# Patient Record
Sex: Female | Born: 1990
Health system: Southern US, Community
[De-identification: ages and names within clinical notes are randomized; demographics above are authoritative.]

## PROBLEM LIST (undated history)

## (undated) ENCOUNTER — Inpatient Hospital Stay (HOSPITAL_COMMUNITY): Payer: Self-pay

## (undated) DIAGNOSIS — F319 Bipolar disorder, unspecified: Secondary | ICD-10-CM

## (undated) DIAGNOSIS — M797 Fibromyalgia: Secondary | ICD-10-CM

## (undated) DIAGNOSIS — M5124 Other intervertebral disc displacement, thoracic region: Secondary | ICD-10-CM

## (undated) DIAGNOSIS — Z87448 Personal history of other diseases of urinary system: Secondary | ICD-10-CM

## (undated) DIAGNOSIS — M069 Rheumatoid arthritis, unspecified: Secondary | ICD-10-CM

## (undated) DIAGNOSIS — J45909 Unspecified asthma, uncomplicated: Secondary | ICD-10-CM

## (undated) DIAGNOSIS — D219 Benign neoplasm of connective and other soft tissue, unspecified: Secondary | ICD-10-CM

## (undated) DIAGNOSIS — N12 Tubulo-interstitial nephritis, not specified as acute or chronic: Secondary | ICD-10-CM

## (undated) DIAGNOSIS — M51369 Other intervertebral disc degeneration, lumbar region without mention of lumbar back pain or lower extremity pain: Secondary | ICD-10-CM

## (undated) DIAGNOSIS — N390 Urinary tract infection, site not specified: Secondary | ICD-10-CM

## (undated) DIAGNOSIS — M5136 Other intervertebral disc degeneration, lumbar region: Secondary | ICD-10-CM

## (undated) DIAGNOSIS — F429 Obsessive-compulsive disorder, unspecified: Secondary | ICD-10-CM

## (undated) DIAGNOSIS — O24419 Gestational diabetes mellitus in pregnancy, unspecified control: Secondary | ICD-10-CM

## (undated) DIAGNOSIS — Z87440 Personal history of urinary (tract) infections: Secondary | ICD-10-CM

## (undated) DIAGNOSIS — G43109 Migraine with aura, not intractable, without status migrainosus: Secondary | ICD-10-CM

## (undated) DIAGNOSIS — D649 Anemia, unspecified: Secondary | ICD-10-CM

## (undated) DIAGNOSIS — E282 Polycystic ovarian syndrome: Secondary | ICD-10-CM

## (undated) HISTORY — DX: Bipolar disorder, unspecified: F31.9

## (undated) HISTORY — DX: Other intervertebral disc displacement, thoracic region: M51.24

## (undated) HISTORY — DX: Other intervertebral disc degeneration, lumbar region: M51.36

## (undated) HISTORY — DX: Obsessive-compulsive disorder, unspecified: F42.9

## (undated) HISTORY — PX: NO PAST SURGERIES: SHX2092

## (undated) HISTORY — DX: Other intervertebral disc degeneration, lumbar region without mention of lumbar back pain or lower extremity pain: M51.369

## (undated) HISTORY — DX: Migraine with aura, not intractable, without status migrainosus: G43.109

---
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
## 2010-02-13 ENCOUNTER — Ambulatory Visit (HOSPITAL_COMMUNITY)
Admission: RE | Admit: 2010-02-13 | Discharge: 2010-02-13 | Payer: Self-pay | Source: Home / Self Care | Attending: Psychiatry | Admitting: Psychiatry

## 2010-03-20 ENCOUNTER — Encounter (HOSPITAL_COMMUNITY): Payer: 59 | Admitting: Physician Assistant

## 2010-03-20 DIAGNOSIS — F39 Unspecified mood [affective] disorder: Secondary | ICD-10-CM

## 2010-04-21 ENCOUNTER — Encounter (HOSPITAL_COMMUNITY): Payer: 59 | Admitting: Physician Assistant

## 2010-05-19 ENCOUNTER — Ambulatory Visit (HOSPITAL_COMMUNITY): Payer: 59 | Admitting: Marriage and Family Therapist

## 2010-06-02 NOTE — Discharge Summary (Signed)
NAMEROSS, HEFFERAN              ACCOUNT NO.:  [id number]   MEDICAL RECORD NO.:  [id number]          PATIENT TYPE:  INP   LOCATION:  9125                          FACILITY:  [facility]   PHYSICIAN:  Malachi Pro. Ambrose Mantle, M.D. DATE OF BIRTH:  03-Sep-1990   DATE OF ADMISSION:  07/27/2007  DATE OF DISCHARGE:  07/29/2007                               DISCHARGE SUMMARY   HISTORY OF PRESENT ILLNESS:  A 17-year white female para 0, gravida 1 at  39 plus weeks, had spontaneous rupture of membranes with clear fluid.  She had good fetal movement, no vaginal bleeding, occasional  contractions.  She had a late entry to prenatal care.  Otherwise, she  had an uncomplicated prenatal course.  She had spontaneous rupture of  membranes at 4:30 a.m. on the day of admission.  Blood group and type, A  positive.  Negative antibody, GC and chlamydia negative, RPR  nonreactive, rubella immune, cystic fibrosis negative, hepatitis B  surface antigen negative, HIV negative.  One-hour Glucola 145.  Three-  hour GTT 91, 68, 133, and 91.  Group B strep was positive.  She had an  ultrasound on May 29, 2007.  Estimated due date was July 30, 2007.  She  had an ultrasound on April 06, 2007.  Estimated due date of July 30, 2007.   PAST MEDICAL HISTORY:  History of migraines.   PAST SURGICAL HISTORY:  No surgical history.   OBSTETRICAL HISTORY:  No OB/GYN history.  She had no history of abnormal  Paps or STDs.  She took prenatal vitamins.   ALLERGIES:  She was had no known allergies.   FAMILY HISTORY:  Positive for hypertension in father and lung cancer.  Maternal grandfather and paternal grandfather both with lung cancer.  Sister with anxiety.  Maternal uncle alcoholic.  Diabetes in paternal  grandmother.   On admission, she was afebrile with normal vital signs.  Heart and lungs  were normal.  The abdomen was soft.  The fetal heart tones were in 140s.  Cervix was 1 cm, 40% vertex at -3.  The patient was started on  penicillin for group B strep prophylaxis.  She was placed on Pitocin for  augmentation.  By 1:30 p.m., she was uncomfortable with contractions.  Fetal heart tones were in the 120s-130s.  Contractions were every 2-4  minutes.  Pitocin was at 10 milliunits a minute.  By 5:50 p.m., she had  received an epidural.  Fetal heart tones were normal except for some  early decelerations and some variables and intrauterine pressure  catheter was placed without difficulty.  The cervix was 5 cm, 90%.  She  progressed to complete dilatation and began pushing.  She pushed  approximately 15 minutes to deliver a living female infant 8 pounds 8  ounces with Apgars of 8 at one and 9 at five minutes.  Placenta was  expressed intact.  There was a second-degree perineal in right and  bilateral periurethral lacerations repaired with 3-0 Vicryl.  Blood loss  was about 500 mL.   POSTPARTUM:  The patient did well and was discharged on  the second  postpartum day.   LABORATORY DATA:  Initial hemoglobin 11. 6, hematocrit 33.7, white count  10,400, platelet count 149,000.  Followup hemoglobin 10.0, hematocrit  28.5, white count 15,200, platelet count 144,000.  RPR was nonreactive.   FINAL DIAGNOSIS:  Intrauterine pregnancy at 39 weeks, delivered vertex.  Operations, spontaneous delivery vertex, repair of lacerations.   FINAL CONDITION:  Improved.   INSTRUCTIONS:  Include our regular discharge instruction booklet.  Percocet 5/325 30 tablets 1 q.4-6 h. as needed for pain and Motrin 600  mg 30 tablets 1 q.6 h. as needed for pain is given at discharge.  The  patient is advised to return in 6 weeks for followup examination.      Malachi Pro. Ambrose Mantle, M.D.  Electronically Signed     TFH/MEDQ  D:  07/29/2007  T:  07/29/2007  Job:  [id number]

## 2010-06-09 ENCOUNTER — Encounter (HOSPITAL_COMMUNITY): Payer: 59 | Admitting: Physician Assistant

## 2010-06-09 DIAGNOSIS — F39 Unspecified mood [affective] disorder: Secondary | ICD-10-CM

## 2010-06-17 ENCOUNTER — Ambulatory Visit (HOSPITAL_BASED_OUTPATIENT_CLINIC_OR_DEPARTMENT_OTHER): Payer: 59 | Admitting: Marriage and Family Therapist

## 2010-06-17 DIAGNOSIS — F3189 Other bipolar disorder: Secondary | ICD-10-CM

## 2010-06-23 ENCOUNTER — Encounter (HOSPITAL_COMMUNITY): Payer: 59 | Admitting: Marriage and Family Therapist

## 2010-06-29 ENCOUNTER — Encounter (HOSPITAL_COMMUNITY): Payer: 59 | Admitting: Marriage and Family Therapist

## 2010-06-29 DIAGNOSIS — F429 Obsessive-compulsive disorder, unspecified: Secondary | ICD-10-CM

## 2010-06-29 DIAGNOSIS — F329 Major depressive disorder, single episode, unspecified: Secondary | ICD-10-CM

## 2010-06-29 DIAGNOSIS — F3289 Other specified depressive episodes: Secondary | ICD-10-CM

## 2010-07-15 ENCOUNTER — Encounter (HOSPITAL_COMMUNITY): Payer: 59 | Admitting: Physician Assistant

## 2010-07-15 DIAGNOSIS — F39 Unspecified mood [affective] disorder: Secondary | ICD-10-CM

## 2010-08-03 ENCOUNTER — Encounter (HOSPITAL_COMMUNITY): Payer: 59 | Admitting: Marriage and Family Therapist

## 2010-08-12 ENCOUNTER — Encounter (HOSPITAL_COMMUNITY): Payer: 59 | Admitting: Physician Assistant

## 2010-08-12 DIAGNOSIS — F3189 Other bipolar disorder: Secondary | ICD-10-CM

## 2010-08-17 ENCOUNTER — Encounter (HOSPITAL_COMMUNITY): Payer: 59 | Admitting: Marriage and Family Therapist

## 2010-08-24 ENCOUNTER — Encounter (HOSPITAL_COMMUNITY): Payer: 59 | Admitting: Marriage and Family Therapist

## 2010-08-25 ENCOUNTER — Encounter (HOSPITAL_COMMUNITY): Payer: 59 | Admitting: Physician Assistant

## 2010-08-26 ENCOUNTER — Encounter (HOSPITAL_COMMUNITY): Payer: 59 | Admitting: Marriage and Family Therapist

## 2010-10-09 LAB — URINALYSIS, ROUTINE W REFLEX MICROSCOPIC
Bilirubin Urine: NEGATIVE
Glucose, UA: NEGATIVE
Hgb urine dipstick: NEGATIVE
Ketones, ur: NEGATIVE
Nitrite: NEGATIVE
Urobilinogen, UA: 1

## 2010-10-09 LAB — POCT PREGNANCY, URINE: Operator id: 272551

## 2010-10-09 LAB — WET PREP, GENITAL: Trich, Wet Prep: NONE SEEN

## 2010-10-09 LAB — GC/CHLAMYDIA PROBE AMP, GENITAL: Chlamydia, DNA Probe: NEGATIVE

## 2010-10-09 LAB — URINE MICROSCOPIC-ADD ON

## 2010-10-09 LAB — URINE CULTURE

## 2010-10-09 LAB — HCG, QUANTITATIVE, PREGNANCY: hCG, Beta Chain, Quant, S: 7433 — ABNORMAL HIGH

## 2010-10-09 LAB — ABO/RH: ABO/RH(D): A POS

## 2010-10-15 LAB — CBC
HCT: 33.7 — ABNORMAL LOW
Hemoglobin: 10 — ABNORMAL LOW
Hemoglobin: 11.6 — ABNORMAL LOW
MCHC: 34.3
MCHC: 35
MCV: 93.4
Platelets: 149 — ABNORMAL LOW
RBC: 3.61 — ABNORMAL LOW
RDW: 15.4

## 2010-10-15 LAB — RPR: RPR Ser Ql: NONREACTIVE

## 2011-10-13 LAB — HM PAP SMEAR

## 2012-08-27 ENCOUNTER — Encounter (HOSPITAL_BASED_OUTPATIENT_CLINIC_OR_DEPARTMENT_OTHER): Payer: Self-pay | Admitting: Emergency Medicine

## 2012-08-27 ENCOUNTER — Emergency Department (HOSPITAL_BASED_OUTPATIENT_CLINIC_OR_DEPARTMENT_OTHER): Payer: 59

## 2012-08-27 ENCOUNTER — Emergency Department (HOSPITAL_BASED_OUTPATIENT_CLINIC_OR_DEPARTMENT_OTHER)
Admission: EM | Admit: 2012-08-27 | Discharge: 2012-08-27 | Disposition: A | Discharge status: Home / Self Care | Payer: 59 | Attending: Emergency Medicine | Admitting: Emergency Medicine

## 2012-08-27 DIAGNOSIS — J45909 Unspecified asthma, uncomplicated: Secondary | ICD-10-CM | POA: Insufficient documentation

## 2012-08-27 DIAGNOSIS — R110 Nausea: Secondary | ICD-10-CM | POA: Insufficient documentation

## 2012-08-27 DIAGNOSIS — Z3202 Encounter for pregnancy test, result negative: Secondary | ICD-10-CM | POA: Insufficient documentation

## 2012-08-27 DIAGNOSIS — R350 Frequency of micturition: Secondary | ICD-10-CM | POA: Insufficient documentation

## 2012-08-27 DIAGNOSIS — Z91040 Latex allergy status: Secondary | ICD-10-CM | POA: Insufficient documentation

## 2012-08-27 DIAGNOSIS — N12 Tubulo-interstitial nephritis, not specified as acute or chronic: Secondary | ICD-10-CM | POA: Insufficient documentation

## 2012-08-27 DIAGNOSIS — Z8742 Personal history of other diseases of the female genital tract: Secondary | ICD-10-CM | POA: Insufficient documentation

## 2012-08-27 DIAGNOSIS — Z79899 Other long term (current) drug therapy: Secondary | ICD-10-CM | POA: Insufficient documentation

## 2012-08-27 DIAGNOSIS — N898 Other specified noninflammatory disorders of vagina: Secondary | ICD-10-CM | POA: Insufficient documentation

## 2012-08-27 DIAGNOSIS — Z87891 Personal history of nicotine dependence: Secondary | ICD-10-CM | POA: Insufficient documentation

## 2012-08-27 HISTORY — DX: Unspecified asthma, uncomplicated: J45.909

## 2012-08-27 LAB — CBC WITH DIFFERENTIAL/PLATELET
Basophils Relative: 0 % (ref 0–1)
Eosinophils Relative: 1 % (ref 0–5)
Hemoglobin: 14.2 g/dL (ref 12.0–15.0)
Lymphocytes Relative: 20 % (ref 12–46)
MCH: 30.5 pg (ref 26.0–34.0)
MCV: 89 fL (ref 78.0–100.0)
Monocytes Relative: 5 % (ref 3–12)
Neutrophils Relative %: 74 % (ref 43–77)
Platelets: 304 10*3/uL (ref 150–400)
RDW: 13.4 % (ref 11.5–15.5)
WBC: 16.6 10*3/uL — ABNORMAL HIGH (ref 4.0–10.5)

## 2012-08-27 LAB — COMPREHENSIVE METABOLIC PANEL
ALT: 19 U/L (ref 0–35)
BUN: 8 mg/dL (ref 6–23)
Calcium: 10.2 mg/dL (ref 8.4–10.5)
Chloride: 99 mEq/L (ref 96–112)
Creatinine, Ser: 0.8 mg/dL (ref 0.50–1.10)
GFR calc non Af Amer: >90 mL/min (ref >90)
Sodium: 137 mEq/L (ref 135–145)
Total Protein: 7.9 g/dL (ref 6.0–8.3)

## 2012-08-27 LAB — URINE MICROSCOPIC-ADD ON

## 2012-08-27 LAB — URINALYSIS, ROUTINE W REFLEX MICROSCOPIC
Specific Gravity, Urine: 1.015 (ref 1.005–1.030)
pH: 5 (ref 5.0–8.0)

## 2012-08-27 LAB — PREGNANCY, URINE: Preg Test, Ur: NEGATIVE

## 2012-08-27 LAB — LIPASE, BLOOD: Lipase: 21 U/L (ref 11–59)

## 2012-08-27 MED ORDER — ONDANSETRON HCL 4 MG/2ML IJ SOLN
4.0000 mg | Freq: Once | INTRAMUSCULAR | Status: AC
  Administered 2012-08-27: 4 mg via INTRAVENOUS
  Filled 2012-08-27: qty 2

## 2012-08-27 MED ORDER — OXYCODONE-ACETAMINOPHEN 5-325 MG PO TABS: 2.0000 | ORAL_TABLET | ORAL | Status: DC | PRN

## 2012-08-27 MED ORDER — MORPHINE SULFATE 4 MG/ML IJ SOLN
4.0000 mg | Freq: Once | INTRAMUSCULAR | Status: AC
  Administered 2012-08-27: 4 mg via INTRAVENOUS
  Filled 2012-08-27: qty 1

## 2012-08-27 MED ORDER — CEPHALEXIN 500 MG PO CAPS: 500.0000 mg | ORAL_CAPSULE | Freq: Four times a day (QID) | ORAL | Status: DC

## 2012-08-27 MED ORDER — KETOROLAC TROMETHAMINE 30 MG/ML IJ SOLN
30.0000 mg | Freq: Once | INTRAMUSCULAR | Status: AC
  Administered 2012-08-27: 30 mg via INTRAVENOUS
  Filled 2012-08-27: qty 1

## 2012-08-27 MED ORDER — DEXTROSE 5 % IV SOLN
1.0000 g | INTRAVENOUS | Status: DC
  Administered 2012-08-27: 1 g via INTRAVENOUS
  Filled 2012-08-27: qty 10

## 2012-08-27 NOTE — ED Notes (Signed)
RLQ pain and tenderness began during night last night.  Right flank pain in past hour. Nausea but no vomiting.

## 2012-08-27 NOTE — ED Provider Notes (Signed)
CSN: [id number]     Arrival date & time 08/27/12  1801 History    This chart was scribed for Toy Baker, MD by Quintella Reichert, ED scribe.  This patient was seen in room MH12/MH12 and the patient's care was started at 6:24 PM.     Chief Complaint  Patient presents with  . Abdominal Pain    The history is provided by the patient. No language interpreter was used.    HPI Comments: Kathleen Sanders is a 22 y.o. female with h/o PCOS who presents to the Emergency Department complaining of constant moderate RLQ pain that began last night, with accompanying nausea, urinary frequency, and vaginal bleeding.  Pain began when pt was sleeping and has since moved "slightly upwards and towards the back."  It is described as "a combination of pressure and sharp."  It is exacerbated by walking.  It is not associated with food.  Pt has not attempted to treat pain pta.  She also states she feels she is urinating more frequently than normal and complains of "light, mucusy-brown" vaginal bleeding.  LNMP ended last week.   She denies emesis or fever.     Past Medical History  Diagnosis Date  . Asthma     History reviewed. No pertinent past surgical history.   No family history on file.   History  Substance Use Topics  . Smoking status: Former Games developer  . Smokeless tobacco: Not on file  . Alcohol Use: Yes     Comment: occ    OB History   Grav Para Term Preterm Abortions TAB SAB Ect Mult Living                   Review of Systems  Gastrointestinal: Positive for nausea and abdominal pain. Negative for vomiting.  Genitourinary: Positive for frequency and vaginal bleeding.  All other systems reviewed and are negative.      Allergies  Latex  Home Medications   Current Outpatient Rx  Name  Route  Sig  Dispense  Refill  . norethindrone-ethinyl estradiol (JUNEL FE,GILDESS FE,LOESTRIN FE) 1-20 MG-MCG tablet   Oral   Take 1 tablet by mouth daily.          BP 135/64  Pulse 88   Temp(Src) 98.7 F (37.1 C) (Oral)  Resp 20  SpO2 100%  LMP 08/13/2012  Physical Exam  Nursing note and vitals reviewed. Constitutional: She is oriented to person, place, and time. She appears well-developed and well-nourished.  Non-toxic appearance. No distress.  HENT:  Head: Normocephalic and atraumatic.  Eyes: Conjunctivae, EOM and lids are normal. Pupils are equal, round, and reactive to light.  Neck: Normal range of motion. Neck supple. No tracheal deviation present. No mass present.  Cardiovascular: Normal rate, regular rhythm and normal heart sounds.  Exam reveals no gallop.   No murmur heard. Pulmonary/Chest: Effort normal and breath sounds normal. No stridor. No respiratory distress. She has no decreased breath sounds. She has no wheezes. She has no rhonchi. She has no rales.  Abdominal: Soft. Normal appearance and bowel sounds are normal. She exhibits no distension and no mass (None palpable). There is tenderness (RUQ and RLQ). There is no rebound, no guarding and no CVA tenderness.  Musculoskeletal: Normal range of motion. She exhibits no edema and no tenderness.  Neurological: She is alert and oriented to person, place, and time. She has normal strength. No cranial nerve deficit or sensory deficit. GCS eye subscore is 4. GCS verbal subscore is  5. GCS motor subscore is 6.  Skin: Skin is warm and dry. No abrasion and no rash noted.  Psychiatric: She has a normal mood and affect. Her speech is normal and behavior is normal.    ED Course  Procedures (including critical care time)  DIAGNOSTIC STUDIES: Oxygen Saturation is 100% on room air, normal by my interpretation.    COORDINATION OF CARE: 6:30 PM-Discussed treatment plan which includes pain medication, anti-emetics, UA, labs and abdomen CT with pt at bedside and pt agreed to plan.    Labs Reviewed  URINALYSIS, ROUTINE W REFLEX MICROSCOPIC - Abnormal; Notable for the following:    APPearance CLOUDY (*)    Hgb urine  dipstick MODERATE (*)    Protein, ur 100 (*)    Leukocytes, UA MODERATE (*)    All other components within normal limits  CBC WITH DIFFERENTIAL - Abnormal; Notable for the following:    WBC 16.6 (*)    Neutro Abs 12.2 (*)    All other components within normal limits  URINE MICROSCOPIC-ADD ON - Abnormal; Notable for the following:    Bacteria, UA MANY (*)    All other components within normal limits  URINE CULTURE  PREGNANCY, URINE  COMPREHENSIVE METABOLIC PANEL  LIPASE, BLOOD    Ct Abdomen Pelvis Wo Contrast  08/27/2012   *RADIOLOGY REPORT*  Clinical Data: Right flank pain  CT ABDOMEN AND PELVIS WITHOUT CONTRAST  Technique:  Multidetector CT imaging of the abdomen and pelvis was performed following the standard protocol without intravenous contrast.  Comparison: None.  Findings: There is no hydronephrosis.  The central right renal sinus fat is hazy.  There is also stranding along the course of the right ureter.  There is slight dilatation of the right ureter.  No ureteral calculi.  Unremarkable appearance of the left kidney.  Unenhanced liver, gallbladder, spleen, pancreas, adrenal glands are normal.  Uterus, adnexa, and bladder are normal.  Advanced degenerative disc disease at L5 S1 with vacuum.  IMPRESSION: Although no ureteral calculus or hydronephrosis is present, there is a stranding involving the right renal collecting system and ureter.  Findings most likely represent a recently passed calculus or an inflammatory process.  Correlate with urinalysis.   Original Report Authenticated By: Jolaine Click, M.D.    No diagnosis found.  MDM  Patient given Rocephin here and pain medication feels better. She be treated for pyelonephritis in stable for discharge    I personally performed the services described in this documentation, which was scribed in my presence. The recorded information has been reviewed and is accurate.     Toy Baker, MD 08/27/12 2016

## 2012-08-29 LAB — URINE CULTURE

## 2012-08-30 NOTE — ED Notes (Signed)
+   Urine Culture Patient treated per protocol MD.

## 2012-08-31 ENCOUNTER — Other Ambulatory Visit: Payer: Self-pay | Admitting: Obstetrics and Gynecology

## 2012-08-31 NOTE — Telephone Encounter (Signed)
eScribe request for refill on JUNEL FE 1/20 Last filled - 10/13/11, 1 PK X 12 RF, pt using continuous Last AEX - 10/13/11 Next AEX - not scheduled. Refill sent as pt is using continuous.

## 2012-10-16 ENCOUNTER — Encounter: Payer: Self-pay | Admitting: Nurse Practitioner

## 2012-10-16 ENCOUNTER — Ambulatory Visit (INDEPENDENT_AMBULATORY_CARE_PROVIDER_SITE_OTHER): Payer: 59 | Admitting: Nurse Practitioner

## 2012-10-16 VITALS — BP 142/72 | HR 76 | Ht 66.0 in | Wt 254.0 lb

## 2012-10-16 DIAGNOSIS — Z01419 Encounter for gynecological examination (general) (routine) without abnormal findings: Secondary | ICD-10-CM

## 2012-10-16 DIAGNOSIS — Z0000 Encounter for general adult medical examination without abnormal findings: Secondary | ICD-10-CM

## 2012-10-16 LAB — POCT URINALYSIS DIPSTICK
Glucose, UA: NEGATIVE
Nitrite, UA: NEGATIVE
Urobilinogen, UA: NEGATIVE

## 2012-10-16 MED ORDER — NORETHINDRONE ACET-ETHINYL EST 1.5-30 MG-MCG PO TABS: ORAL_TABLET | ORAL | Status: DC

## 2012-10-16 NOTE — Progress Notes (Signed)
Patient ID: Kathleen Sanders, female   DOB: 03/18/1990, 22 y.o.   MRN: [id number] 22 y.o. G1P1001 Legally Separated Caucasian Fe here for annual exam.  Divorce is on hold due to cost.  Same partner for 1.5 years.  He is actually the husbands best friend.   Patient's last menstrual period was 10/11/2012.          Sexually active: yes  The current method of family planning is OCP (estrogen/progesterone).    Exercising: No. The patient does not participate in regular exercise at present. Smoker:  Former smoker   Health Maintenance: Pap:  10/13/11, WNL TDaP:  Age 57 Gardasil: 2 injection completed Labs: HB: Urine: neg.   reports that she has quit smoking. She does not have any smokeless tobacco history on file. She reports that  drinks alcohol. She reports that she does not use illicit drugs.  Past Medical History  Diagnosis Date  . Asthma   . Bipolar disorder   . OCD (obsessive compulsive disorder)   . Migraine     No past surgical history on file.  Current Outpatient Prescriptions  Medication Sig Dispense Refill  . cephALEXin (KEFLEX) 500 MG capsule Take 1 capsule (500 mg total) by mouth 4 (four) times daily.  28 capsule  0  . JUNEL 1.5/30 1.5-30 MG-MCG tablet TAKE 1 TABLET BY MOUTH EVERY DAY  21 tablet  1  . norethindrone-ethinyl estradiol (JUNEL FE,GILDESS FE,LOESTRIN FE) 1-20 MG-MCG tablet Take 1 tablet by mouth daily.      Marland Kitchen oxyCODONE-acetaminophen (PERCOCET/ROXICET) 5-325 MG per tablet Take 2 tablets by mouth every 4 (four) hours as needed for pain.  20 tablet  0   No current facility-administered medications for this visit.    Family History  Problem Relation Age of Onset  . Colon cancer Maternal Grandmother   . Colon cancer Paternal Grandfather     ROS:  Pertinent items are noted in HPI.  Otherwise, a comprehensive ROS was negative.  Exam:   BP 142/72  Pulse 76  Ht 5\' 6"  (1.676 m)  Wt 254 lb (115.214 kg)  BMI 41.02 kg/m2  LMP 10/11/2012 Height: 5\' 6"  (167.6 cm)  Ht  Readings from Last 3 Encounters:  10/16/12 5\' 6"  (1.676 m)    General appearance: alert, cooperative and appears stated age Head: Normocephalic, without obvious abnormality, atraumatic Neck: no adenopathy, supple, symmetrical, trachea midline and thyroid normal to inspection and palpation Lungs: clear to auscultation bilaterally Breasts: normal appearance, no masses or tenderness Heart: regular rate and rhythm Abdomen: soft, non-tender; no masses,  no organomegaly Extremities: extremities normal, atraumatic, no cyanosis or edema Skin: Skin color, texture, turgor normal. No rashes or lesions Lymph nodes: Cervical, supraclavicular, and axillary nodes normal. No abnormal inguinal nodes palpated Neurologic: Grossly normal   Pelvic: External genitalia:  no lesions              Urethra:  normal appearing urethra with no masses, tenderness or lesions              Bartholin's and Skene's: normal                 Vagina: normal appearing vagina with normal color and discharge, no lesions              Cervix: anteverted              Pap taken: no Bimanual Exam:  Uterus:  normal size, contour, position, consistency, mobility, non-tender  Adnexa: no mass, fullness, tenderness               Rectovaginal: Confirms               Anus:  normal sphincter tone, no lesions  A:  Well Woman with normal exam  Contraception  Marital discord  P:   Pap smear as per guidelines Not done today  Refill on OCP Junel 1.5/30 for a year  Counseled on diet, exercise and weight management return annually or prn  An After Visit Summary was printed and given to the patient.

## 2012-10-16 NOTE — Patient Instructions (Addendum)
General topics  Next pap or exam is  due in 1 year Take a Women's multivitamin Take 1200 mg. of calcium daily - prefer dietary If any concerns in interim to call back  Breast Self-Awareness Practicing breast self-awareness may pick up problems early, prevent significant medical complications, and possibly save your life. By practicing breast self-awareness, you can become familiar with how your breasts look and feel and if your breasts are changing. This allows you to notice changes early. It can also offer you some reassurance that your breast health is good. One way to learn what is normal for your breasts and whether your breasts are changing is to do a breast self-exam. If you find a lump or something that was not present in the past, it is best to contact your caregiver right away. Other findings that should be evaluated by your caregiver include nipple discharge, especially if it is bloody; skin changes or reddening; areas where the skin seems to be pulled in (retracted); or new lumps and bumps. Breast pain is seldom associated with cancer (malignancy), but should also be evaluated by a caregiver. BREAST SELF-EXAM The best time to examine your breasts is 5 7 days after your menstrual period is over.  ExitCare Patient Information 2013 [employer].   Exercise to Stay Healthy Exercise helps you become and stay healthy. EXERCISE IDEAS AND TIPS Choose exercises that:  You enjoy.  Fit into your day. You do not need to exercise really hard to be healthy. You can do exercises at a slow or medium level and stay healthy. You can:  Stretch before and after working out.  Try yoga, Pilates, or tai chi.  Lift weights.  Walk fast, swim, jog, run, climb stairs, bicycle, dance, or rollerskate.  Take aerobic classes. Exercises that burn about 150 calories:  Running 1  miles in 15 minutes.  Playing volleyball for 45 to 60 minutes.  Washing and waxing a car for 45 to 60  minutes.  Playing touch football for 45 minutes.  Walking 1  miles in 35 minutes.  Pushing a stroller 1  miles in 30 minutes.  Playing basketball for 30 minutes.  Raking leaves for 30 minutes.  Bicycling 5 miles in 30 minutes.  Walking 2 miles in 30 minutes.  Dancing for 30 minutes.  Shoveling snow for 15 minutes.  Swimming laps for 20 minutes.  Walking up stairs for 15 minutes.  Bicycling 4 miles in 15 minutes.  Gardening for 30 to 45 minutes.  Jumping rope for 15 minutes.  Washing windows or floors for 45 to 60 minutes. Document Released: 02/06/2010 Document Revised: 03/29/2011 Document Reviewed: 02/06/2010 ExitCare Patient Information 2013 [employer].   Other topics ( that may be useful information):    Sexually Transmitted Disease Sexually transmitted disease (STD) refers to any infection that is passed from person to person during sexual activity. This may happen by way of saliva, semen, blood, vaginal mucus, or urine. Common STDs include:  Gonorrhea.  Chlamydia.  Syphilis.  HIV/AIDS.  Genital herpes.  Hepatitis B and C.  Trichomonas.  Human papillomavirus (HPV).  Pubic lice. CAUSES  An STD may be spread by bacteria, virus, or parasite. A person can get an STD by:  Sexual intercourse with an infected person.  Sharing sex toys with an infected person.  Sharing needles with an infected person.  Having intimate contact with the genitals, mouth, or rectal areas of an infected person. SYMPTOMS  Some people may not have any symptoms, but   they can still pass the infection to others. Different STDs have different symptoms. Symptoms include:  Painful or bloody urination.  Pain in the pelvis, abdomen, vagina, anus, throat, or eyes.  Skin rash, itching, irritation, growths, or sores (lesions). These usually occur in the genital or anal area.  Abnormal vaginal discharge.  Penile discharge in men.  Soft, flesh-colored skin growths in the  genital or anal area.  Fever.  Pain or bleeding during sexual intercourse.  Swollen glands in the groin area.  Yellow skin and eyes (jaundice). This is seen with hepatitis. DIAGNOSIS  To make a diagnosis, your caregiver may:  Take a medical history.  Perform a physical exam.  Take a specimen (culture) to be examined.  Examine a sample of discharge under a microscope.  Perform blood test TREATMENT   Chlamydia, gonorrhea, trichomonas, and syphilis can be cured with antibiotic medicine.  Genital herpes, hepatitis, and HIV can be treated, but not cured, with prescribed medicines. The medicines will lessen the symptoms.  Genital warts from HPV can be treated with medicine or by freezing, burning (electrocautery), or surgery. Warts may come back.  HPV is a virus and cannot be cured with medicine or surgery.However, abnormal areas may be followed very closely by your caregiver and may be removed from the cervix, vagina, or vulva through office procedures or surgery. If your diagnosis is confirmed, your recent sexual partners need treatment. This is true even if they are symptom-free or have a negative culture or evaluation. They should not have sex until their caregiver says it is okay. HOME CARE INSTRUCTIONS  All sexual partners should be informed, tested, and treated for all STDs.  Take your antibiotics as directed. Finish them even if you start to feel better.  Only take over-the-counter or prescription medicines for pain, discomfort, or fever as directed by your caregiver.  Rest.  Eat a balanced diet and drink enough fluids to keep your urine clear or pale yellow.  Do not have sex until treatment is completed and you have followed up with your caregiver. STDs should be checked after treatment.  Keep all follow-up appointments, Pap tests, and blood tests as directed by your caregiver.  Only use latex condoms and water-soluble lubricants during sexual activity. Do not use  petroleum jelly or oils.  Avoid alcohol and illegal drugs.  Get vaccinated for HPV and hepatitis. If you have not received these vaccines in the past, talk to your caregiver about whether one or both might be right for you.  Avoid risky sex practices that can break the skin. The only way to avoid getting an STD is to avoid all sexual activity.Latex condoms and dental dams (for oral sex) will help lessen the risk of getting an STD, but will not completely eliminate the risk. SEEK MEDICAL CARE IF:   You have a fever.  You have any new or worsening symptoms. Document Released: 03/27/2002 Document Revised: 03/29/2011 Document Reviewed: 04/03/2010 ExitCare Patient Information 2013 [employer].    Domestic Abuse You are being battered or abused if someone close to you hits, pushes, or physically hurts you in any way. You also are being abused if you are forced into activities. You are being sexually abused if you are forced to have sexual contact of any kind. You are being emotionally abused if you are made to feel worthless or if you are constantly threatened. It is important to remember that help is available. No one has the right to abuse you. PREVENTION OF FURTHER   ABUSE  Learn the warning signs of danger. This varies with situations but may include: the use of alcohol, threats, isolation from friends and family, or forced sexual contact. Leave if you feel that violence is going to occur.  If you are attacked or beaten, report it to the police so the abuse is documented. You do not have to press charges. The police can protect you while you or the attackers are leaving. Get the officer's name and badge number and a copy of the report.  Find someone you can trust and tell them what is happening to you: your caregiver, a nurse, clergy member, close friend or family member. Feeling ashamed is natural, but remember that you have done nothing wrong. No one deserves abuse. Document Released:  01/02/2000 Document Revised: 03/29/2011 Document Reviewed: 03/12/2010 ExitCare Patient Information 2013 [employer].    How Much is Too Much Alcohol? Drinking too much alcohol can cause injury, accidents, and health problems. These types of problems can include:   Car crashes.  Falls.  Family fighting (domestic violence).  Drowning.  Fights.  Injuries.  Burns.  Damage to certain organs.  Having a baby with birth defects. ONE DRINK CAN BE TOO MUCH WHEN YOU ARE:  Working.  Pregnant or breastfeeding.  Taking medicines. Ask your doctor.  Driving or planning to drive. If you or someone you know has a drinking problem, get help from a doctor.  Document Released: 10/31/2008 Document Revised: 03/29/2011 Document Reviewed: 10/31/2008 ExitCare Patient Information 2013 [employer].   Smoking Hazards Smoking cigarettes is extremely bad for your health. Tobacco smoke has over 200 known poisons in it. There are over 60 chemicals in tobacco smoke that cause cancer. Some of the chemicals found in cigarette smoke include:   Cyanide.  Benzene.  Formaldehyde.  Methanol (wood alcohol).  Acetylene (fuel used in welding torches).  Ammonia. Cigarette smoke also contains the poisonous gases nitrogen oxide and carbon monoxide.  Cigarette smokers have an increased risk of many serious medical problems and Smoking causes approximately:  90% of all lung cancer deaths in men.  80% of all lung cancer deaths in women.  90% of deaths from chronic obstructive lung disease. Compared with nonsmokers, smoking increases the risk of:  Coronary heart disease by 2 to 4 times.  Stroke by 2 to 4 times.  Men developing lung cancer by 23 times.  Women developing lung cancer by 13 times.  Dying from chronic obstructive lung diseases by 12 times.  . Smoking is the most preventable cause of death and disease in our society.  WHY IS SMOKING ADDICTIVE?  Nicotine is the chemical  agent in tobacco that is capable of causing addiction or dependence.  When you smoke and inhale, nicotine is absorbed rapidly into the bloodstream through your lungs. Nicotine absorbed through the lungs is capable of creating a powerful addiction. Both inhaled and non-inhaled nicotine may be addictive.  Addiction studies of cigarettes and spit tobacco show that addiction to nicotine occurs mainly during the teen years, when young people begin using tobacco products. WHAT ARE THE BENEFITS OF QUITTING?  There are many health benefits to quitting smoking.   Likelihood of developing cancer and heart disease decreases. Health improvements are seen almost immediately.  Blood pressure, pulse rate, and breathing patterns start returning to normal soon after quitting. QUITTING SMOKING   American Lung Association - 1-800-LUNGUSA  American Cancer Society - 1-800-ACS-2345 Document Released: 02/12/2004 Document Revised: 03/29/2011 Document Reviewed: 10/16/2008 ExitCare Patient Information 2013 [employer].   Stress Management Stress is a state of physical or mental tension that often results from changes in your life or normal routine. Some common causes of stress are:  Death of a loved one.  Injuries or severe illnesses.  Getting fired or changing jobs.  Moving into a new home. Other causes may be:  Sexual problems.  Business or financial losses.  Taking on a large debt.  Regular conflict with someone at home or at work.  Constant tiredness from lack of sleep. It is not just bad things that are stressful. It may be stressful to:  Win the lottery.  Get married.  Buy a new car. The amount of stress that can be easily tolerated varies from person to person. Changes generally cause stress, regardless of the types of change. Too much stress can affect your health. It may lead to physical or emotional problems. Too little stress (boredom) may also become stressful. SUGGESTIONS TO  REDUCE STRESS:  Talk things over with your family and friends. It often is helpful to share your concerns and worries. If you feel your problem is serious, you may want to get help from a professional counselor.  Consider your problems one at a time instead of lumping them all together. Trying to take care of everything at once may seem impossible. List all the things you need to do and then start with the most important one. Set a goal to accomplish 2 or 3 things each day. If you expect to do too many in a single day you will naturally fail, causing you to feel even more stressed.  Do not use alcohol or drugs to relieve stress. Although you may feel better for a short time, they do not remove the problems that caused the stress. They can also be habit forming.  Exercise regularly - at least 3 times per week. Physical exercise can help to relieve that "uptight" feeling and will relax you.  The shortest distance between despair and hope is often a good night's sleep.  Go to bed and get up on time allowing yourself time for appointments without being rushed.  Take a short "time-out" period from any stressful situation that occurs during the day. Close your eyes and take some deep breaths. Starting with the muscles in your face, tense them, hold it for a few seconds, then relax. Repeat this with the muscles in your neck, shoulders, hand, stomach, back and legs.  Take good care of yourself. Eat a balanced diet and get plenty of rest.  Schedule time for having fun. Take a break from your daily routine to relax. HOME CARE INSTRUCTIONS   Call if you feel overwhelmed by your problems and feel you can no longer manage them on your own.  Return immediately if you feel like hurting yourself or someone else. Document Released: 06/30/2000 Document Revised: 03/29/2011 Document Reviewed: 02/20/2007 ExitCare Patient Information 2013 [employer].   

## 2012-10-17 NOTE — Progress Notes (Signed)
Reviewed personally.  M. Suzanne Juhi Lagrange, MD.  

## 2012-10-25 ENCOUNTER — Telehealth: Payer: Self-pay | Admitting: Nurse Practitioner

## 2012-10-25 NOTE — Telephone Encounter (Signed)
The only other suggestion to query is, if she has changed partners because STD's can cause AUB.

## 2012-10-25 NOTE — Telephone Encounter (Signed)
Pt is having pain with her period and it is early.

## 2012-10-25 NOTE — Telephone Encounter (Signed)
Message left to return call to Salima Rumer at [phone].    

## 2012-10-25 NOTE — Telephone Encounter (Signed)
Kathleen Sanders is a 22 y.o. female Who calls with c/o seeing brown blood when wiping x 2 days and having abdominal cramping. Has been on Junel for over one year. Has not missed any pills.  Last period was approximately 9/20. Due for period next week. Last menses have been WNL for patient.   Advised patient to take home pregnancy test, she is sexually active. Patient does not not feel this is implantation bleeding as she has had that before. I advised to continue birth control pills as directed. Motrin instructions given . Can take Motrin=Advil=Ibuprofen 800 mg (Can purchase over the counter, you will need four 200 mg pills) every 8 hours as needed.  Take with food for cramps.   Advised patient to journal menses and call back if bleeding is ongoing or becomes heavier, or if home pregnancy test in positive patient verbalized understanding. I advised that I would call back if additional instructions from [place].

## 2012-10-26 NOTE — Telephone Encounter (Signed)
Spoke with pt to inquire about any new partners, which pt denies. Pt says the bleeding looks "more normal" today, it is just early for a period. Advised pt to keep track of bleeding on a calendar, take pills at the same time each day, and call for any abnormal bleeding next month. Advised that increased stress could possibly disrupt her periods. Pt will call back if needed.

## 2012-10-26 NOTE — Telephone Encounter (Signed)
Agree with plan 

## 2012-11-05 ENCOUNTER — Other Ambulatory Visit: Payer: Self-pay | Admitting: Gynecology

## 2013-05-31 ENCOUNTER — Encounter: Payer: Self-pay | Admitting: Nurse Practitioner

## 2013-09-27 ENCOUNTER — Telehealth: Payer: Self-pay | Admitting: Nurse Practitioner

## 2013-09-27 NOTE — Telephone Encounter (Signed)
Spoke with patient. Patient states that she has always had a little back pain with her cycles due to PCOS but that is has been increasing over the past two months. States that she has back pain that interferes with her walking and that she sweats and has fevers. LMP ended 2 weeks ago. "It is lasting for a week with my cycle and interfering with my job." Advised patient will need to come in to be seen with Milford Cage, FNP for evaluation. Requests afternoon appointment. Appointment scheduled for 9/15 at 3:45pm. Patient agreeable to date and time.  Routing to provider for final review. Patient agreeable to disposition. Will close encounter

## 2013-09-27 NOTE — Telephone Encounter (Signed)
Pt called during lunch and left a message that she has been having pain for the last few months that is getting worse. Pt stated she has PCOS.

## 2013-10-02 ENCOUNTER — Encounter: Payer: Self-pay | Admitting: Nurse Practitioner

## 2013-10-02 ENCOUNTER — Ambulatory Visit (INDEPENDENT_AMBULATORY_CARE_PROVIDER_SITE_OTHER): Payer: 59 | Admitting: Nurse Practitioner

## 2013-10-02 VITALS — BP 122/70 | HR 88 | Ht 66.0 in | Wt 276.0 lb

## 2013-10-02 DIAGNOSIS — M545 Low back pain, unspecified: Secondary | ICD-10-CM

## 2013-10-02 DIAGNOSIS — Z113 Encounter for screening for infections with a predominantly sexual mode of transmission: Secondary | ICD-10-CM

## 2013-10-02 DIAGNOSIS — N940 Mittelschmerz: Secondary | ICD-10-CM

## 2013-10-02 NOTE — Patient Instructions (Signed)
Will schedule PUS 
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
## 2013-10-05 LAB — IPS N GONORRHOEA AND CHLAMYDIA BY PCR

## 2013-10-08 ENCOUNTER — Other Ambulatory Visit: Payer: Self-pay | Admitting: Nurse Practitioner

## 2013-10-08 ENCOUNTER — Telehealth: Payer: Self-pay | Admitting: Nurse Practitioner

## 2013-10-08 NOTE — Telephone Encounter (Signed)
Spoke with patient briefly. Advised that per benefit quote received, she will be responsible for a $35 copay when she comes in for PUS. Patient agreeable, but will call back to scheduled. She is at work.

## 2013-10-08 NOTE — Progress Notes (Signed)
Encounter reviewed by Dr. Brook Silva.  

## 2013-10-08 NOTE — Telephone Encounter (Signed)
Last AEX: 10/16/12 Last refill:10/16/12  (rx changed) Current AEX: 10/23/13  Please advise

## 2013-10-08 NOTE — Addendum Note (Signed)
Addended by: Michele Mcalpine on: 10/08/2013 12:28 PM   Modules accepted: Orders

## 2013-10-08 NOTE — Telephone Encounter (Signed)
Patient returned call. Scheduled PUS. Advised patient of 72 hour cancellation policy and $683 cancellation fee. Patient agreeable.

## 2013-10-18 ENCOUNTER — Ambulatory Visit (INDEPENDENT_AMBULATORY_CARE_PROVIDER_SITE_OTHER): Payer: 59 | Admitting: Obstetrics and Gynecology

## 2013-10-18 ENCOUNTER — Ambulatory Visit: Payer: 59 | Admitting: Nurse Practitioner

## 2013-10-18 ENCOUNTER — Ambulatory Visit (INDEPENDENT_AMBULATORY_CARE_PROVIDER_SITE_OTHER): Payer: 59

## 2013-10-18 ENCOUNTER — Other Ambulatory Visit: Payer: Self-pay | Admitting: Obstetrics and Gynecology

## 2013-10-18 ENCOUNTER — Encounter: Payer: Self-pay | Admitting: Obstetrics and Gynecology

## 2013-10-18 VITALS — BP 124/78 | HR 72 | Ht 66.0 in | Wt 273.0 lb

## 2013-10-18 DIAGNOSIS — M549 Dorsalgia, unspecified: Secondary | ICD-10-CM | POA: Insufficient documentation

## 2013-10-18 DIAGNOSIS — N940 Mittelschmerz: Secondary | ICD-10-CM

## 2013-10-18 DIAGNOSIS — M545 Low back pain, unspecified: Secondary | ICD-10-CM

## 2013-10-18 NOTE — Patient Instructions (Signed)
You will receive a phone call to schedule an appointment with Dr. Almedia Balls for an evaluation of your back pain.   Diagnostic Laparoscopy Laparoscopy is a surgical procedure. It is used to diagnose and treat diseases inside the belly (abdomen). It is usually a brief, common, and relatively simple procedure. The laparoscopeis a thin, lighted, pencil-sized instrument. It is like a telescope. It is inserted into your abdomen through a small cut (incision). Your caregiver can look at the organs inside your body through this instrument. He or she can see if there is anything abnormal. Laparoscopy can be done either in a hospital or outpatient clinic. You may be given a mild sedative to help you relax before the procedure. Once in the operating room, you will be given a drug to make you sleep (general anesthesia). Laparoscopy usually lasts less than 1 hour. After the procedure, you will be monitored in a recovery area until you are stable and doing well. Once you are home, it will take 2 to 3 days to fully recover. RISKS AND COMPLICATIONS  Laparoscopy has relatively few risks. Your caregiver will discuss the risks with you before the procedure. Some problems that can occur include:  Infection.  Bleeding.  Damage to other organs.  Anesthetic side effects. PROCEDURE Once you receive anesthesia, your surgeon inflates the abdomen with a harmless gas (carbon dioxide). This makes the organs easier to see. The laparoscope is inserted into the abdomen through a small incision. This allows your surgeon to see into the abdomen. Other small instruments are also inserted into the abdomen through other small openings. Many surgeons attach a video camera to the laparoscope to enlarge the view. During a diagnostic laparoscopy, the surgeon may be looking for inflammation, infection, or cancer. Your surgeon may take tissue samples(biopsies). The samples are sent to a specialist in looking at cells and tissue samples  (pathologist). The pathologist examines them under a microscope. Biopsies can help to diagnose or confirm a disease. AFTER THE PROCEDURE   The gas is released from inside the abdomen.  The incisions are closed with stitches (sutures). Because these incisions are small (usually less than 1/2 inch), there is usually minimal discomfort after the procedure. There may be some mild discomfort in the throat. This is from the tube placed in the throat while you were sleeping. You may have some mild abdominal discomfort. There may also be discomfort from the instrument placement incisions in the abdomen.  The recovery time is shortened as long as there are no complications.  You will rest in a recovery room until stable and doing well. As long as there are no complications, you may be allowed to go home. FINDING OUT THE RESULTS OF YOUR TEST Not all test results are available during your visit. If your test results are not back during the visit, make an appointment with your caregiver to find out the results. Do not assume everything is normal if you have not heard from your caregiver or the medical facility. It is important for you to follow up on all of your test results. HOME CARE INSTRUCTIONS   Take all medicines as directed.  Only take over-the-counter or prescription medicines for pain, discomfort, or fever as directed by your caregiver.  Resume daily activities as directed.  Showers are preferred over baths.  You may resume sexual activities in 1 week or as directed.  Do not drive while taking narcotics. SEEK MEDICAL CARE IF:   There is increasing abdominal pain.  There  is new pain in the shoulders (shoulder strap areas).  You feel lightheaded or faint.  You have the chills.  You or your child has an oral temperature above 102 F (38.9 C).  There is pus-like (purulent) drainage from any of the wounds.  You are unable to pass gas or have a bowel movement.  You feel sick to your  stomach (nauseous) or throw up (vomit). MAKE SURE YOU:   Understand these instructions.  Will watch your condition.  Will get help right away if you are not doing well or get worse. Document Released: 04/12/2000 Document Revised: 05/01/2012 Document Reviewed: 01/04/2007 [facility] Patient Information 2015 [place], [place]. This information is not intended to replace advice given to you by your health care provider. Make sure you discuss any questions you have with your health care provider.
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
## 2013-10-23 ENCOUNTER — Encounter: Payer: Self-pay | Admitting: Nurse Practitioner

## 2013-10-23 ENCOUNTER — Ambulatory Visit: Payer: 59 | Admitting: Nurse Practitioner

## 2013-11-19 ENCOUNTER — Encounter: Payer: Self-pay | Admitting: Obstetrics and Gynecology

## 2013-12-06 ENCOUNTER — Telehealth: Payer: Self-pay | Admitting: Nurse Practitioner

## 2013-12-06 ENCOUNTER — Ambulatory Visit (INDEPENDENT_AMBULATORY_CARE_PROVIDER_SITE_OTHER): Payer: 59 | Admitting: Nurse Practitioner

## 2013-12-06 ENCOUNTER — Encounter: Payer: Self-pay | Admitting: Nurse Practitioner

## 2013-12-06 VITALS — BP 112/68 | HR 70 | Resp 18 | Ht 66.75 in | Wt 271.0 lb

## 2013-12-06 DIAGNOSIS — IMO0002 Reserved for concepts with insufficient information to code with codable children: Secondary | ICD-10-CM

## 2013-12-06 DIAGNOSIS — Z0000 Encounter for general adult medical examination without abnormal findings: Secondary | ICD-10-CM

## 2013-12-06 DIAGNOSIS — Z01419 Encounter for gynecological examination (general) (routine) without abnormal findings: Secondary | ICD-10-CM

## 2013-12-06 LAB — POCT URINALYSIS DIPSTICK
LEUKOCYTES UA: NEGATIVE
Protein, UA: 0
UROBILINOGEN UA: NEGATIVE
pH, UA: 5

## 2013-12-06 LAB — POCT URINE PREGNANCY: PREG TEST UR: NEGATIVE

## 2013-12-06 NOTE — Telephone Encounter (Signed)
Patient was called because my error she did not get TDaP as discussed.  She is already on her way home and does not want to return.  States she will try to get done at PCP but if not may return her for an update.  She is in agreement.

## 2013-12-06 NOTE — Patient Instructions (Signed)
General topics  Next pap or exam is  due in 1 year Take a Women's multivitamin Take 1200 mg. of calcium daily - prefer dietary If any concerns in interim to call back  Breast Self-Awareness Practicing breast self-awareness may pick up problems early, prevent significant medical complications, and possibly save your life. By practicing breast self-awareness, you can become familiar with how your breasts look and feel and if your breasts are changing. This allows you to notice changes early. It can also offer you some reassurance that your breast health is good. One way to learn what is normal for your breasts and whether your breasts are changing is to do a breast self-exam. If you find a lump or something that was not present in the past, it is best to contact your caregiver right away. Other findings that should be evaluated by your caregiver include nipple discharge, especially if it is bloody; skin changes or reddening; areas where the skin seems to be pulled in (retracted); or new lumps and bumps. Breast pain is seldom associated with cancer (malignancy), but should also be evaluated by a caregiver. BREAST SELF-EXAM The best time to examine your breasts is 5 7 days after your menstrual period is over.  ExitCare Patient Information 2013 [employer].   Exercise to Stay Healthy Exercise helps you become and stay healthy. EXERCISE IDEAS AND TIPS Choose exercises that:  You enjoy.  Fit into your day. You do not need to exercise really hard to be healthy. You can do exercises at a slow or medium level and stay healthy. You can:  Stretch before and after working out.  Try yoga, Pilates, or tai chi.  Lift weights.  Walk fast, swim, jog, run, climb stairs, bicycle, dance, or rollerskate.  Take aerobic classes. Exercises that burn about 150 calories:  Running 1  miles in 15 minutes.  Playing volleyball for 45 to 60 minutes.  Washing and waxing a car for 45 to 60  minutes.  Playing touch football for 45 minutes.  Walking 1  miles in 35 minutes.  Pushing a stroller 1  miles in 30 minutes.  Playing basketball for 30 minutes.  Raking leaves for 30 minutes.  Bicycling 5 miles in 30 minutes.  Walking 2 miles in 30 minutes.  Dancing for 30 minutes.  Shoveling snow for 15 minutes.  Swimming laps for 20 minutes.  Walking up stairs for 15 minutes.  Bicycling 4 miles in 15 minutes.  Gardening for 30 to 45 minutes.  Jumping rope for 15 minutes.  Washing windows or floors for 45 to 60 minutes. Document Released: 02/06/2010 Document Revised: 03/29/2011 Document Reviewed: 02/06/2010 Exit[employer] Patient Information 2013 [employer].   Other topics ( that may be useful information):    Sexually Transmitted Disease Sexually transmitted disease (STD) refers to any infection that is passed from person to person during sexual activity. This may happen by way of saliva, semen, blood, vaginal mucus, or urine. Common STDs include:  Gonorrhea.  Chlamydia.  Syphilis.  HIV/AIDS.  Genital herpes.  Hepatitis B and C.  Trichomonas.  Human papillomavirus (HPV).  Pubic lice. CAUSES  An STD may be spread by bacteria, virus, or parasite. A person can get an STD by:  Sexual intercourse with an infected person.  Sharing sex toys with an infected person.  Sharing needles with an infected person.  Having intimate contact with the genitals, mouth, or rectal areas of an infected person. SYMPTOMS  Some people may not have any symptoms, but   they can still pass the infection to others. Different STDs have different symptoms. Symptoms include:  Painful or bloody urination.  Pain in the pelvis, abdomen, vagina, anus, throat, or eyes.  Skin rash, itching, irritation, growths, or sores (lesions). These usually occur in the genital or anal area.  Abnormal vaginal discharge.  Penile discharge in men.  Soft, flesh-colored skin growths in the  genital or anal area.  Fever.  Pain or bleeding during sexual intercourse.  Swollen glands in the groin area.  Yellow skin and eyes (jaundice). This is seen with hepatitis. DIAGNOSIS  To make a diagnosis, your caregiver may:  Take a medical history.  Perform a physical exam.  Take a specimen (culture) to be examined.  Examine a sample of discharge under a microscope.  Perform blood test TREATMENT   Chlamydia, gonorrhea, trichomonas, and syphilis can be cured with antibiotic medicine.  Genital herpes, hepatitis, and HIV can be treated, but not cured, with prescribed medicines. The medicines will lessen the symptoms.  Genital warts from HPV can be treated with medicine or by freezing, burning (electrocautery), or surgery. Warts may come back.  HPV is a virus and cannot be cured with medicine or surgery.However, abnormal areas may be followed very closely by your caregiver and may be removed from the cervix, vagina, or vulva through office procedures or surgery. If your diagnosis is confirmed, your recent sexual partners need treatment. This is true even if they are symptom-free or have a negative culture or evaluation. They should not have sex until their caregiver says it is okay. HOME CARE INSTRUCTIONS  All sexual partners should be informed, tested, and treated for all STDs.  Take your antibiotics as directed. Finish them even if you start to feel better.  Only take over-the-counter or prescription medicines for pain, discomfort, or fever as directed by your caregiver.  Rest.  Eat a balanced diet and drink enough fluids to keep your urine clear or pale yellow.  Do not have sex until treatment is completed and you have followed up with your caregiver. STDs should be checked after treatment.  Keep all follow-up appointments, Pap tests, and blood tests as directed by your caregiver.  Only use latex condoms and water-soluble lubricants during sexual activity. Do not use  petroleum jelly or oils.  Avoid alcohol and illegal drugs.  Get vaccinated for HPV and hepatitis. If you have not received these vaccines in the past, talk to your caregiver about whether one or both might be right for you.  Avoid risky sex practices that can break the skin. The only way to avoid getting an STD is to avoid all sexual activity.Latex condoms and dental dams (for oral sex) will help lessen the risk of getting an STD, but will not completely eliminate the risk. SEEK MEDICAL CARE IF:   You have a fever.  You have any new or worsening symptoms. Document Released: 03/27/2002 Document Revised: 03/29/2011 Document Reviewed: 04/03/2010 [facility] Patient Information 2013 [place].    Domestic Abuse You are being battered or abused if someone close to you hits, pushes, or physically hurts you in any way. You also are being abused if you are forced into activities. You are being sexually abused if you are forced to have sexual contact of any kind. You are being emotionally abused if you are made to feel worthless or if you are constantly threatened. It is important to remember that help is available. No one has the right to abuse you. PREVENTION OF FURTHER  ABUSE  Learn the warning signs of danger. This varies with situations but may include: the use of alcohol, threats, isolation from friends and family, or forced sexual contact. Leave if you feel that violence is going to occur.  If you are attacked or beaten, report it to the police so the abuse is documented. You do not have to press charges. The police can protect you while you or the attackers are leaving. Get the officer's name and badge number and a copy of the report.  Find someone you can trust and tell them what is happening to you: your caregiver, a nurse, clergy member, close friend or family member. Feeling ashamed is natural, but remember that you have done nothing wrong. No one deserves abuse. Document Released:  01/02/2000 Document Revised: 03/29/2011 Document Reviewed: 03/12/2010 ExitCare Patient Information 2013 [employer].    How Much is Too Much Alcohol? Drinking too much alcohol can cause injury, accidents, and health problems. These types of problems can include:   Car crashes.  Falls.  Family fighting (domestic violence).  Drowning.  Fights.  Injuries.  Burns.  Damage to certain organs.  Having a baby with birth defects. ONE DRINK CAN BE TOO MUCH WHEN YOU ARE:  Working.  Pregnant or breastfeeding.  Taking medicines. Ask your doctor.  Driving or planning to drive. If you or someone you know has a drinking problem, get help from a doctor.  Document Released: 10/31/2008 Document Revised: 03/29/2011 Document Reviewed: 10/31/2008 ExitCare Patient Information 2013 [employer].   Smoking Hazards Smoking cigarettes is extremely bad for your health. Tobacco smoke has over 200 known poisons in it. There are over 60 chemicals in tobacco smoke that cause cancer. Some of the chemicals found in cigarette smoke include:   Cyanide.  Benzene.  Formaldehyde.  Methanol (wood alcohol).  Acetylene (fuel used in welding torches).  Ammonia. Cigarette smoke also contains the poisonous gases nitrogen oxide and carbon monoxide.  Cigarette smokers have an increased risk of many serious medical problems and Smoking causes approximately:  90% of all lung cancer deaths in men.  80% of all lung cancer deaths in women.  90% of deaths from chronic obstructive lung disease. Compared with nonsmokers, smoking increases the risk of:  Coronary heart disease by 2 to 4 times.  Stroke by 2 to 4 times.  Men developing lung cancer by 23 times.  Women developing lung cancer by 13 times.  Dying from chronic obstructive lung diseases by 12 times.  . Smoking is the most preventable cause of death and disease in our society.  WHY IS SMOKING ADDICTIVE?  Nicotine is the chemical  agent in tobacco that is capable of causing addiction or dependence.  When you smoke and inhale, nicotine is absorbed rapidly into the bloodstream through your lungs. Nicotine absorbed through the lungs is capable of creating a powerful addiction. Both inhaled and non-inhaled nicotine may be addictive.  Addiction studies of cigarettes and spit tobacco show that addiction to nicotine occurs mainly during the teen years, when young people begin using tobacco products. WHAT ARE THE BENEFITS OF QUITTING?  There are many health benefits to quitting smoking.   Likelihood of developing cancer and heart disease decreases. Health improvements are seen almost immediately.  Blood pressure, pulse rate, and breathing patterns start returning to normal soon after quitting. QUITTING SMOKING   American Lung Association - 1-800-LUNGUSA  American Cancer Society - 1-800-ACS-2345 Document Released: 02/12/2004 Document Revised: 03/29/2011 Document Reviewed: 10/16/2008 ExitCare Patient Information 2013 ExitCare,   LL[employer].   Stress Management Stress is a state of physical or mental tension that often results from changes in your life or normal routine. Some common causes of stress are:  Death of a loved one.  Injuries or severe illnesses.  Getting fired or changing jobs.  Moving into a new home. Other causes may be:  Sexual problems.  Business or financial losses.  Taking on a large debt.  Regular conflict with someone at home or at work.  Constant tiredness from lack of sleep. It is not just bad things that are stressful. It may be stressful to:  Win the lottery.  Get married.  Buy a new car. The amount of stress that can be easily tolerated varies from person to person. Changes generally cause stress, regardless of the types of change. Too much stress can affect your health. It may lead to physical or emotional problems. Too little stress (boredom) may also become stressful. SUGGESTIONS TO  REDUCE STRESS:  Talk things over with your family and friends. It often is helpful to share your concerns and worries. If you feel your problem is serious, you may want to get help from a professional counselor.  Consider your problems one at a time instead of lumping them all together. Trying to take care of everything at once may seem impossible. List all the things you need to do and then start with the most important one. Set a goal to accomplish 2 or 3 things each day. If you expect to do too many in a single day you will naturally fail, causing you to feel even more stressed.  Do not use alcohol or drugs to relieve stress. Although you may feel better for a short time, they do not remove the problems that caused the stress. They can also be habit forming.  Exercise regularly - at least 3 times per week. Physical exercise can help to relieve that "uptight" feeling and will relax you.  The shortest distance between despair and hope is often a good night's sleep.  Go to bed and get up on time allowing yourself time for appointments without being rushed.  Take a short "time-out" period from any stressful situation that occurs during the day. Close your eyes and take some deep breaths. Starting with the muscles in your face, tense them, hold it for a few seconds, then relax. Repeat this with the muscles in your neck, shoulders, hand, stomach, back and legs.  Take good care of yourself. Eat a balanced diet and get plenty of rest.  Schedule time for having fun. Take a break from your daily routine to relax. HOME CARE INSTRUCTIONS   Call if you feel overwhelmed by your problems and feel you can no longer manage them on your own.  Return immediately if you feel like hurting yourself or someone else. Document Released: 06/30/2000 Document Revised: 03/29/2011 Document Reviewed: 02/20/2007 ExitCare Patient Information 2013 [employer].  

## 2013-12-06 NOTE — Progress Notes (Signed)
23 y.o. [id number] Divorced Caucasian Fe here for annual exam.  Menses last 4-5 days.  Heavy for 2 days, super tampon changing every 2-3 hour.   No cramps, PMS.  Same partner 2.5 years and now engaged.   The ex husband is now wanting to have a relationship with their son who is 6.  She has been trying for pregnancy X 4 months.  Partner is a little frustrated that they are not pregnant yet and worries that something may be wrong with him.  Seen recently in September for back pain.  Originally seemed to  be related to mid cycle, now seeing Ortho tomorrow and planning on getting an MRI.   Patient's last menstrual period was 11/16/2013.          Sexually active: Yes.    The current method of family planning is none.    Exercising: No.  The patient does not participate in regular exercise at present. Smoker:  no  Health Maintenance: Pap:  10/13/11 NEG  TDaP: ? Age 4[birth year]13 Labs: Hgb: 13.1 , Urine: Negative, Urine Pregnancy: Negative   reports that she quit smoking about 18 months ago. She has never used smokeless tobacco. She reports that she drinks alcohol. She reports that she does not use illicit drugs.  Past Medical History  Diagnosis Date  . Asthma   . Bipolar disorder   . OCD (obsessive compulsive disorder)   . Migraine     No past surgical history on file.  Current Outpatient Prescriptions  Medication Sig Dispense Refill  . traMADol (ULTRAM) 50 MG tablet Take 50 mg by mouth as needed.     No current facility-administered medications for this visit.    Family History  Problem Relation Age of Onset  . Colon cancer Maternal Grandmother   . Colon cancer Paternal Grandfather   . Prostate cancer Paternal Grandfather     also lung cancer  . Prostate cancer Father     ROS:  Pertinent items are noted in HPI.  Otherwise, a comprehensive ROS was negative.  Exam:   BP 112/68 mmHg  Pulse 70  Resp 18  Ht 5' 6.75" (1.695 m)  Wt 271 lb (122.925 kg)  BMI 42.79 kg/m2  LMP 11/16/2013  Height: 5' 6.75" (169.5 cm)  Ht Readings from Last 3 Encounters:  12/06/13 5' 6.75" (1.695 m)  10/18/13 5\' 6"  (1.676 m)  10/02/13 5\' 6"  (1.676 m)    General appearance: alert, cooperative and appears stated age Head: Normocephalic, without obvious abnormality, atraumatic Neck: no adenopathy, supple, symmetrical, trachea midline and thyroid normal to inspection and palpation Lungs: clear to auscultation bilaterally Breasts: normal appearance, no masses or tenderness Heart: regular rate and rhythm Abdomen: soft, non-tender; no masses,  no organomegaly Extremities: extremities normal, atraumatic, no cyanosis or edema Skin: Skin color, texture, turgor normal. No rashes or lesions Lymph nodes: Cervical, supraclavicular, and axillary nodes normal. No abnormal inguinal nodes palpated Neurologic: Grossly normal   Pelvic: External genitalia:  no lesions              Urethra:  normal appearing urethra with no masses, tenderness or lesions              Bartholin's and Skene's: normal                 Vagina: normal appearing vagina with normal color and discharge, no lesions              Cervix: anteverted  Pap taken: Yes.   Bimanual Exam:  Uterus:  normal size, contour, position, consistency, mobility, non-tender              Adnexa: no mass, fullness, tenderness               Rectovaginal: Confirms               Anus:  normal sphincter tone, no lesions  A:  Well Woman with normal exam  Now planning for pregnancy History of back pain - now will go to Ortho  P:   Reviewed health and wellness pertinent to exam  Pap smear taken today  Advise to update TDaP - but left without immunization being done - she was called and will try to get at PCP  She is advised to get serum HCG prior to MRI to confirm not pregnant when that is scheduled by Ortho  Also advised to start on prenatal MVI  Counseled on breast self exam, adequate intake of calcium and vitamin D, diet and  exercise return annually or prn  An After Visit Summary was printed and given to the patient.

## 2013-12-07 LAB — IPS PAP TEST WITH REFLEX TO HPV

## 2013-12-07 LAB — HEMOGLOBIN, FINGERSTICK: HEMOGLOBIN, FINGERSTICK: 13.1 g/dL (ref 12.0–16.0)

## 2013-12-09 NOTE — Progress Notes (Signed)
Encounter reviewed by Dr. Cherylyn Sundby Silva.  

## 2014-04-22 ENCOUNTER — Encounter (HOSPITAL_COMMUNITY): Payer: Self-pay

## 2014-04-22 ENCOUNTER — Emergency Department (HOSPITAL_COMMUNITY)
Admission: EM | Admit: 2014-04-22 | Discharge: 2014-04-22 | Disposition: A | Discharge status: Home / Self Care | Payer: BLUE CROSS/BLUE SHIELD | Attending: Emergency Medicine | Admitting: Emergency Medicine

## 2014-04-22 DIAGNOSIS — M545 Low back pain: Secondary | ICD-10-CM

## 2014-04-22 DIAGNOSIS — Z87891 Personal history of nicotine dependence: Secondary | ICD-10-CM | POA: Insufficient documentation

## 2014-04-22 DIAGNOSIS — J45909 Unspecified asthma, uncomplicated: Secondary | ICD-10-CM | POA: Diagnosis not present

## 2014-04-22 DIAGNOSIS — Z3202 Encounter for pregnancy test, result negative: Secondary | ICD-10-CM | POA: Insufficient documentation

## 2014-04-22 DIAGNOSIS — Z8659 Personal history of other mental and behavioral disorders: Secondary | ICD-10-CM | POA: Insufficient documentation

## 2014-04-22 DIAGNOSIS — Z8679 Personal history of other diseases of the circulatory system: Secondary | ICD-10-CM | POA: Insufficient documentation

## 2014-04-22 DIAGNOSIS — Z91040 Latex allergy status: Secondary | ICD-10-CM | POA: Insufficient documentation

## 2014-04-22 LAB — URINALYSIS, ROUTINE W REFLEX MICROSCOPIC
Bilirubin Urine: NEGATIVE
Glucose, UA: NEGATIVE mg/dL
HGB URINE DIPSTICK: NEGATIVE
KETONES UR: NEGATIVE mg/dL
Leukocytes, UA: NEGATIVE
Nitrite: NEGATIVE
PROTEIN: NEGATIVE mg/dL
SPECIFIC GRAVITY, URINE: 1.026 (ref 1.005–1.030)
UROBILINOGEN UA: 0.2 mg/dL (ref 0.0–1.0)
pH: 5.5 (ref 5.0–8.0)

## 2014-04-22 LAB — I-STAT CHEM 8, ED
BUN: 11 mg/dL (ref 6–23)
CHLORIDE: 105 mmol/L (ref 96–112)
CREATININE: 0.9 mg/dL (ref 0.50–1.10)
Calcium, Ion: 1.14 mmol/L (ref 1.12–1.23)
Glucose, Bld: 154 mg/dL — ABNORMAL HIGH (ref 70–99)
HCT: 37 % (ref 36.0–46.0)
Hemoglobin: 12.6 g/dL (ref 12.0–15.0)
Potassium: 3.8 mmol/L (ref 3.5–5.1)
SODIUM: 140 mmol/L (ref 135–145)
TCO2: 19 mmol/L (ref 0–100)

## 2014-04-22 LAB — POC URINE PREG, ED: Preg Test, Ur: NEGATIVE

## 2014-04-22 MED ORDER — HYDROCODONE-ACETAMINOPHEN 5-325 MG PO TABS: 1.0000 | ORAL_TABLET | Freq: Four times a day (QID) | ORAL | Status: DC | PRN

## 2014-04-22 MED ORDER — HYDROCODONE-ACETAMINOPHEN 5-325 MG PO TABS
2.0000 | ORAL_TABLET | Freq: Once | ORAL | Status: AC
  Administered 2014-04-22: 2 via ORAL
  Filled 2014-04-22 (×2): qty 2

## 2014-04-22 NOTE — ED Provider Notes (Signed)
CSN: [id number]     Arrival date & time 04/22/14  0815 History   First MD Initiated Contact with Patient 04/22/14 [id number]     No chief complaint on file.    (Consider location/radiation/quality/duration/timing/severity/associated sxs/prior Treatment) HPI Comments: Patient presents to the ED with a chief complaint of left sided low back pain.  States that she has history of back pain.  States that she has seen a back specialist and is being worked up for herniated disk.  She states that she has not been able to follow-up because of insurance issues.  She also states that she has frequent UTIs and kidney infections.  She denies fevers, chills, nausea, vomiting, or dysuria.  The pain is worsened with palpation and movement.  Denies any trauma or injury.  The history is provided by the patient. No language interpreter was used.    Past Medical History  Diagnosis Date  . Asthma   . Bipolar disorder   . OCD (obsessive compulsive disorder)   . Migraine    No past surgical history on file. Family History  Problem Relation Age of Onset  . Colon cancer Maternal Grandmother   . Colon cancer Paternal Grandfather   . Prostate cancer Paternal Grandfather     also lung cancer  . Prostate cancer Father    History  Substance Use Topics  . Smoking status: Former Smoker -- 0.10 packs/day for 2 years    Quit date: 05/18/2012  . Smokeless tobacco: Never Used  . Alcohol Use: Yes     Comment: occ   OB History    Gravida Para Term Preterm AB TAB SAB Ectopic Multiple Living   1 1 1       1      Review of Systems  Constitutional: Negative for fever and chills.  Gastrointestinal:       No bowel incontinence  Genitourinary:       No urinary incontinence  Musculoskeletal: Positive for myalgias, back pain and arthralgias.  Neurological:       No saddle anesthesia  All other systems reviewed and are negative.     Allergies  Latex  Home Medications   Prior to Admission medications    Medication Sig Start Date End Date Taking? Authorizing Provider  traMADol (ULTRAM) 50 MG tablet Take 50 mg by mouth as needed. 11/29/13 11/29/14  Historical Provider, MD   There were no vitals taken for this visit. Physical Exam  Constitutional: She is oriented to person, place, and time. She appears well-developed and well-nourished. No distress.  HENT:  Head: Normocephalic and atraumatic.  Eyes: Conjunctivae and EOM are normal. Right eye exhibits no discharge. Left eye exhibits no discharge. No scleral icterus.  Neck: Normal range of motion. Neck supple. No tracheal deviation present.  Cardiovascular: Normal rate, regular rhythm and normal heart sounds.  Exam reveals no gallop and no friction rub.   No murmur heard. Pulmonary/Chest: Effort normal and breath sounds normal. No respiratory distress. She has no wheezes.  Abdominal: Soft. She exhibits no distension. There is no tenderness.  No focal abdominal tenderness, no RLQ tenderness or pain at McBurney's point, no RUQ tenderness or Murphy's sign, no left-sided abdominal tenderness, no fluid wave, or signs of peritonitis   Musculoskeletal: Normal range of motion.  Left lumbar paraspinal muscles tender to palpation, no bony tenderness, step-offs, or gross abnormality or deformity of spine, patient is able to ambulate, moves all extremities  Bilateral great toe extension intact Bilateral plantar/dorsiflexion intact  Neurological: She  is alert and oriented to person, place, and time. She has normal reflexes.  Sensation and strength intact bilaterally Symmetrical reflexes  Skin: Skin is warm. She is not diaphoretic.  Psychiatric: She has a normal mood and affect. Her behavior is normal. Judgment and thought content normal.  Nursing note and vitals reviewed.   ED Course  Procedures (including critical care time) Results for orders placed or performed during the hospital encounter of 04/22/14  Urinalysis, Routine w reflex microscopic   Result Value Ref Range   Color, Urine YELLOW YELLOW   APPearance CLEAR CLEAR   Specific Gravity, Urine 1.026 1.005 - 1.030   pH 5.5 5.0 - 8.0   Glucose, UA NEGATIVE NEGATIVE mg/dL   Hgb urine dipstick NEGATIVE NEGATIVE   Bilirubin Urine NEGATIVE NEGATIVE   Ketones, ur NEGATIVE NEGATIVE mg/dL   Protein, ur NEGATIVE NEGATIVE mg/dL   Urobilinogen, UA 0.2 0.0 - 1.0 mg/dL   Nitrite NEGATIVE NEGATIVE   Leukocytes, UA NEGATIVE NEGATIVE  I-stat chem 8, ed  Result Value Ref Range   Sodium 140 135 - 145 mmol/L   Potassium 3.8 3.5 - 5.1 mmol/L   Chloride 105 96 - 112 mmol/L   BUN 11 6 - 23 mg/dL   Creatinine, Ser 0.90 0.50 - 1.10 mg/dL   Glucose, Bld 154 (H) 70 - 99 mg/dL   Calcium, Ion 1.14 1.12 - 1.23 mmol/L   TCO2 19 0 - 100 mmol/L   Hemoglobin 12.6 12.0 - 15.0 g/dL   HCT 37.0 36.0 - 46.0 %  POC urine preg, ED (not at [facility])  Result Value Ref Range   Preg Test, Ur NEGATIVE NEGATIVE   No results found.    EKG Interpretation None      MDM   Final diagnoses:  Low back pain, unspecified back pain laterality, with sciatica presence unspecified    Patient with acute on chronic low back pain.  No abdominal pain.  No dysuria.  Will check chem 8, UA, and will treat pain.  Will reassess.  Anticipate discharge with ortho follow-up.   Urinalysis is unremarkable, urine pregnancy is negative, normal Chem-8.   Patient with back pain.  No neurological deficits and normal neuro exam.  Patient is ambulatory.  No loss of bowel or bladder control.  Doubt cauda equina.  Denies fever,  doubt epidural abscess or other lesion. Recommend back exercises, stretching, RICE, and will treat with a short course of norco.  Encouraged the patient that there could be a need for additional workup and/or imaging such as MRI, if the symptoms do not resolve. Patient advised that if the back pain does not resolve, or radiates, this could progress to more serious conditions and is encouraged to follow-up with PCP  or orthopedics within 2 weeks.      Montine Circle, PA-C 04/22/14 [place], MD 04/22/14 [phone]

## 2014-04-22 NOTE — ED Notes (Signed)
Pt. Is driving her car home.  She has declined the Vicodin presently.  She feels she does not need the medication presenlty.

## 2014-04-22 NOTE — Discharge Instructions (Signed)
Sciatica with Rehab The sciatic nerve runs from the back down the leg and is responsible for sensation and control of the muscles in the back (posterior) side of the thigh, lower leg, and foot. Sciatica is a condition that is characterized by inflammation of this nerve.  SYMPTOMS   Signs of nerve damage, including numbness and/or weakness along the posterior side of the lower extremity.  Pain in the back of the thigh that may also travel down the leg.  Pain that worsens when sitting for long periods of time.  Occasionally, pain in the back or buttock. CAUSES  Inflammation of the sciatic nerve is the cause of sciatica. The inflammation is due to something irritating the nerve. Common sources of irritation include:  Sitting for long periods of time.  Direct trauma to the nerve.  Arthritis of the spine.  Herniated or ruptured disk.  Slipping of the vertebrae (spondylolisthesis).  Pressure from soft tissues, such as muscles or ligament-like tissue (fascia). RISK INCREASES WITH:  Sports that place pressure or stress on the spine (football or weightlifting).  Poor strength and flexibility.  Failure to warm up properly before activity.  Family history of low back pain or disk disorders.  Previous back injury or surgery.  Poor body mechanics, especially when lifting, or poor posture. PREVENTION   Warm up and stretch properly before activity.  Maintain physical fitness:  Strength, flexibility, and endurance.  Cardiovascular fitness.  Learn and use proper technique, especially with posture and lifting. When possible, have coach correct improper technique.  Avoid activities that place stress on the spine. PROGNOSIS If treated properly, then sciatica usually resolves within 6 weeks. However, occasionally surgery is necessary.  RELATED COMPLICATIONS   Permanent nerve damage, including pain, numbness, tingle, or weakness.  Chronic back pain.  Risks of surgery: infection,  bleeding, nerve damage, or damage to surrounding tissues. TREATMENT Treatment initially involves resting from any activities that aggravate your symptoms. The use of ice and medication may help reduce pain and inflammation. The use of strengthening and stretching exercises may help reduce pain with activity. These exercises may be performed at home or with referral to a therapist. A therapist may recommend further treatments, such as transcutaneous electronic nerve stimulation (TENS) or ultrasound. Your caregiver may recommend corticosteroid injections to help reduce inflammation of the sciatic nerve. If symptoms persist despite non-surgical (conservative) treatment, then surgery may be recommended. MEDICATION  If pain medication is necessary, then nonsteroidal anti-inflammatory medications, such as aspirin and ibuprofen, or other minor pain relievers, such as acetaminophen, are often recommended.  Do not take pain medication for 7 days before surgery.  Prescription pain relievers may be given if deemed necessary by your caregiver. Use only as directed and only as much as you need.  Ointments applied to the skin may be helpful.  Corticosteroid injections may be given by your caregiver. These injections should be reserved for the most serious cases, because they may only be given a certain number of times. HEAT AND COLD  Cold treatment (icing) relieves pain and reduces inflammation. Cold treatment should be applied for 10 to 15 minutes every 2 to 3 hours for inflammation and pain and immediately after any activity that aggravates your symptoms. Use ice packs or massage the area with a piece of ice (ice massage).  Heat treatment may be used prior to performing the stretching and strengthening activities prescribed by your caregiver, physical therapist, or athletic trainer. Use a heat pack or soak the injury in warm water.   SEEK MEDICAL CARE IF:  Treatment seems to offer no benefit, or the condition  worsens.  Any medications produce adverse side effects. EXERCISES  RANGE OF MOTION (ROM) AND STRETCHING EXERCISES - Sciatica Most people with sciatic will find that their symptoms worsen with either excessive bending forward (flexion) or arching at the low back (extension). The exercises which will help resolve your symptoms will focus on the opposite motion. Your physician, physical therapist or athletic trainer will help you determine which exercises will be most helpful to resolve your low back pain. Do not complete any exercises without first consulting with your clinician. Discontinue any exercises which worsen your symptoms until you speak to your clinician. If you have pain, numbness or tingling which travels down into your buttocks, leg or foot, the goal of the therapy is for these symptoms to move closer to your back and eventually resolve. Occasionally, these leg symptoms will get better, but your low back pain may worsen; this is typically an indication of progress in your rehabilitation. Be certain to be very alert to any changes in your symptoms and the activities in which you participated in the 24 hours prior to the change. Sharing this information with your clinician will allow him/her to most efficiently treat your condition. These exercises may help you when beginning to rehabilitate your injury. Your symptoms may resolve with or without further involvement from your physician, physical therapist or athletic trainer. While completing these exercises, remember:   Restoring tissue flexibility helps normal motion to return to the joints. This allows healthier, less painful movement and activity.  An effective stretch should be held for at least 30 seconds.  A stretch should never be painful. You should only feel a gentle lengthening or release in the stretched tissue. FLEXION RANGE OF MOTION AND STRETCHING EXERCISES: STRETCH - Flexion, Single Knee to Chest   Lie on a firm bed or floor  with both legs extended in front of you.  Keeping one leg in contact with the floor, bring your opposite knee to your chest. Hold your leg in place by either grabbing behind your thigh or at your knee.  Pull until you feel a gentle stretch in your low back. Hold __________ seconds.  Slowly release your grasp and repeat the exercise with the opposite side. Repeat __________ times. Complete this exercise __________ times per day.  STRETCH - Flexion, Double Knee to Chest  Lie on a firm bed or floor with both legs extended in front of you.  Keeping one leg in contact with the floor, bring your opposite knee to your chest.  Tense your stomach muscles to support your back and then lift your other knee to your chest. Hold your legs in place by either grabbing behind your thighs or at your knees.  Pull both knees toward your chest until you feel a gentle stretch in your low back. Hold __________ seconds.  Tense your stomach muscles and slowly return one leg at a time to the floor. Repeat __________ times. Complete this exercise __________ times per day.  STRETCH - Low Trunk Rotation   Lie on a firm bed or floor. Keeping your legs in front of you, bend your knees so they are both pointed toward the ceiling and your feet are flat on the floor.  Extend your arms out to the side. This will stabilize your upper body by keeping your shoulders in contact with the floor.  Gently and slowly drop both knees together to one side until   you feel a gentle stretch in your low back. Hold for __________ seconds.  Tense your stomach muscles to support your low back as you bring your knees back to the starting position. Repeat the exercise to the other side. Repeat __________ times. Complete this exercise __________ times per day  EXTENSION RANGE OF MOTION AND FLEXIBILITY EXERCISES: STRETCH - Extension, Prone on Elbows  Lie on your stomach on the floor, a bed will be too soft. Place your palms about shoulder  width apart and at the height of your head.  Place your elbows under your shoulders. If this is too painful, stack pillows under your chest.  Allow your body to relax so that your hips drop lower and make contact more completely with the floor.  Hold this position for __________ seconds.  Slowly return to lying flat on the floor. Repeat __________ times. Complete this exercise __________ times per day.  RANGE OF MOTION - Extension, Prone Press Ups  Lie on your stomach on the floor, a bed will be too soft. Place your palms about shoulder width apart and at the height of your head.  Keeping your back as relaxed as possible, slowly straighten your elbows while keeping your hips on the floor. You may adjust the placement of your hands to maximize your comfort. As you gain motion, your hands will come more underneath your shoulders.  Hold this position __________ seconds.  Slowly return to lying flat on the floor. Repeat __________ times. Complete this exercise __________ times per day.  STRENGTHENING EXERCISES - Sciatica  These exercises may help you when beginning to rehabilitate your injury. These exercises should be done near your "sweet spot." This is the neutral, low-back arch, somewhere between fully rounded and fully arched, that is your least painful position. When performed in this safe range of motion, these exercises can be used for people who have either a flexion or extension based injury. These exercises may resolve your symptoms with or without further involvement from your physician, physical therapist or athletic trainer. While completing these exercises, remember:   Muscles can gain both the endurance and the strength needed for everyday activities through controlled exercises.  Complete these exercises as instructed by your physician, physical therapist or athletic trainer. Progress with the resistance and repetition exercises only as your caregiver advises.  You may  experience muscle soreness or fatigue, but the pain or discomfort you are trying to eliminate should never worsen during these exercises. If this pain does worsen, stop and make certain you are following the directions exactly. If the pain is still present after adjustments, discontinue the exercise until you can discuss the trouble with your clinician. STRENGTHENING - Deep Abdominals, Pelvic Tilt   Lie on a firm bed or floor. Keeping your legs in front of you, bend your knees so they are both pointed toward the ceiling and your feet are flat on the floor.  Tense your lower abdominal muscles to press your low back into the floor. This motion will rotate your pelvis so that your tail bone is scooping upwards rather than pointing at your feet or into the floor.  With a gentle tension and even breathing, hold this position for __________ seconds. Repeat __________ times. Complete this exercise __________ times per day.  STRENGTHENING - Abdominals, Crunches   Lie on a firm bed or floor. Keeping your legs in front of you, bend your knees so they are both pointed toward the ceiling and your feet are flat on the   floor. Cross your arms over your chest.  Slightly tip your chin down without bending your neck.  Tense your abdominals and slowly lift your trunk high enough to just clear your shoulder blades. Lifting higher can put excessive stress on the low back and does not further strengthen your abdominal muscles.  Control your return to the starting position. Repeat __________ times. Complete this exercise __________ times per day.  STRENGTHENING - Quadruped, Opposite UE/LE Lift  Assume a hands and knees position on a firm surface. Keep your hands under your shoulders and your knees under your hips. You may place padding under your knees for comfort.  Find your neutral spine and gently tense your abdominal muscles so that you can maintain this position. Your shoulders and hips should form a rectangle  that is parallel with the floor and is not twisted.  Keeping your trunk steady, lift your right hand no higher than your shoulder and then your left leg no higher than your hip. Make sure you are not holding your breath. Hold this position __________ seconds.  Continuing to keep your abdominal muscles tense and your back steady, slowly return to your starting position. Repeat with the opposite arm and leg. Repeat __________ times. Complete this exercise __________ times per day.  STRENGTHENING - Abdominals and Quadriceps, Straight Leg Raise   Lie on a firm bed or floor with both legs extended in front of you.  Keeping one leg in contact with the floor, bend the other knee so that your foot can rest flat on the floor.  Find your neutral spine, and tense your abdominal muscles to maintain your spinal position throughout the exercise.  Slowly lift your straight leg off the floor about 6 inches for a count of 15, making sure to not hold your breath.  Still keeping your neutral spine, slowly lower your leg all the way to the floor. Repeat this exercise with each leg __________ times. Complete this exercise __________ times per day. POSTURE AND BODY MECHANICS CONSIDERATIONS - Sciatica Keeping correct posture when sitting, standing or completing your activities will reduce the stress put on different body tissues, allowing injured tissues a chance to heal and limiting painful experiences. The following are general guidelines for improved posture. Your physician or physical therapist will provide you with any instructions specific to your needs. While reading these guidelines, remember:  The exercises prescribed by your provider will help you have the flexibility and strength to maintain correct postures.  The correct posture provides the optimal environment for your joints to work. All of your joints have less wear and tear when properly supported by a spine with good posture. This means you will  experience a healthier, less painful body.  Correct posture must be practiced with all of your activities, especially prolonged sitting and standing. Correct posture is as important when doing repetitive low-stress activities (typing) as it is when doing a single heavy-load activity (lifting). RESTING POSITIONS Consider which positions are most painful for you when choosing a resting position. If you have pain with flexion-based activities (sitting, bending, stooping, squatting), choose a position that allows you to rest in a less flexed posture. You would want to avoid curling into a fetal position on your side. If your pain worsens with extension-based activities (prolonged standing, working overhead), avoid resting in an extended position such as sleeping on your stomach. Most people will find more comfort when they rest with their spine in a more neutral position, neither too rounded nor too   arched. Lying on a non-sagging bed on your side with a pillow between your knees, or on your back with a pillow under your knees will often provide some relief. Keep in mind, being in any one position for a prolonged period of time, no matter how correct your posture, can still lead to stiffness. PROPER SITTING POSTURE In order to minimize stress and discomfort on your spine, you must sit with correct posture Sitting with good posture should be effortless for a healthy body. Returning to good posture is a gradual process. Many people can work toward this most comfortably by using various supports until they have the flexibility and strength to maintain this posture on their own. When sitting with proper posture, your ears will fall over your shoulders and your shoulders will fall over your hips. You should use the back of the chair to support your upper back. Your low back will be in a neutral position, just slightly arched. You may place a small pillow or folded towel at the base of your low back for support.  When  working at a desk, create an environment that supports good, upright posture. Without extra support, muscles fatigue and lead to excessive strain on joints and other tissues. Keep these recommendations in mind: CHAIR:   A chair should be able to slide under your desk when your back makes contact with the back of the chair. This allows you to work closely.  The chair's height should allow your eyes to be level with the upper part of your monitor and your hands to be slightly lower than your elbows. BODY POSITION  Your feet should make contact with the floor. If this is not possible, use a foot rest.  Keep your ears over your shoulders. This will reduce stress on your neck and low back. INCORRECT SITTING POSTURES   If you are feeling tired and unable to assume a healthy sitting posture, do not slouch or slump. This puts excessive strain on your back tissues, causing more damage and pain. Healthier options include:  Using more support, like a lumbar pillow.  Switching tasks to something that requires you to be upright or walking.  Talking a brief walk.  Lying down to rest in a neutral-spine position. PROLONGED STANDING WHILE SLIGHTLY LEANING FORWARD  When completing a task that requires you to lean forward while standing in one place for a long time, place either foot up on a stationary 2-4 inch high object to help maintain the best posture. When both feet are on the ground, the low back tends to lose its slight inward curve. If this curve flattens (or becomes too large), then the back and your other joints will experience too much stress, fatigue more quickly and can cause pain.  CORRECT STANDING POSTURES Proper standing posture should be assumed with all daily activities, even if they only take a few moments, like when brushing your teeth. As in sitting, your ears should fall over your shoulders and your shoulders should fall over your hips. You should keep a slight tension in your abdominal  muscles to brace your spine. Your tailbone should point down to the ground, not behind your body, resulting in an over-extended swayback posture.  INCORRECT STANDING POSTURES  Common incorrect standing postures include a forward head, locked knees and/or an excessive swayback. WALKING Walk with an upright posture. Your ears, shoulders and hips should all line-up. PROLONGED ACTIVITY IN A FLEXED POSITION When completing a task that requires you to bend forward   at your waist or lean over a low surface, try to find a way to stabilize 3 of 4 of your limbs. You can place a hand or elbow on your thigh or rest a knee on the surface you are reaching across. This will provide you more stability so that your muscles do not fatigue as quickly. By keeping your knees relaxed, or slightly bent, you will also reduce stress across your low back. CORRECT LIFTING TECHNIQUES DO :   Assume a wide stance. This will provide you more stability and the opportunity to get as close as possible to the object which you are lifting.  Tense your abdominals to brace your spine; then bend at the knees and hips. Keeping your back locked in a neutral-spine position, lift using your leg muscles. Lift with your legs, keeping your back straight.  Test the weight of unknown objects before attempting to lift them.  Try to keep your elbows locked down at your sides in order get the best strength from your shoulders when carrying an object.  Always ask for help when lifting heavy or awkward objects. INCORRECT LIFTING TECHNIQUES DO NOT:   Lock your knees when lifting, even if it is a small object.  Bend and twist. Pivot at your feet or move your feet when needing to change directions.  Assume that you cannot safely pick up a paperclip without proper posture. Document Released: 01/04/2005 Document Revised: 05/21/2013 Document Reviewed: 04/18/2008 [employer] Patient Information 2015 [employer]. This information is not intended to  replace advice given to you by your health care provider. Make sure you discuss any questions you have with your health care provider.  

## 2014-04-22 NOTE — ED Notes (Signed)
Pt. Is also aware of needing an urine specimen

## 2014-04-22 NOTE — ED Notes (Signed)
Lt. Lower back pain began a few days ago. Denies any injuries.  Denies having any UTI symptoms.  Denies any vaginal discharge.  GCS 15, pain increases with movement.

## 2014-07-01 ENCOUNTER — Telehealth: Payer: Self-pay | Admitting: Nurse Practitioner

## 2014-07-01 NOTE — Telephone Encounter (Signed)
Patient wanting to speak with nurse about getting a prescription for anxiety medicine.

## 2014-07-01 NOTE — Telephone Encounter (Signed)
Call to patient. She states that for two years she has been having increased symptoms of anxiety during her period. She states she has been feeling more nervous during her cycles and easily frustrated and confused. Patient states she has a diagnosis of bipolar and OCD but has not been under the care of a psychiatrist in two more years. Patient denies any thoughts of self harm or harm to anyone else. She tried to call her prior psychiatrist but was sent to voicemail. Patient denies any depression symptoms, just reports "feeling anxious during my period."   Offered patient appointment with Dr. Sabra Heck or Dr. Quincy Simmonds here in office to discuss her symptoms during her cycle and patient declined. Advised patient that PCP, urgent care or psychiatry can also treat for these symptoms. Patient declined office visit to discuss. She states she thinks "I will just go to urgent care."   Patient then wishes to schedule appointment to discuss prior plan for laparoscopy that was discussed with Dr. Quincy Simmonds and that she has tried for pregnancy for one year with new partner and has not conceived.   Consult with Dr. Quincy Simmonds scheduled for 07/15/14 to discuss. Patient agreeable.   Routing to provider for final review. Patient agreeable to disposition. Will close encounter. Routing to Dr. Sabra Heck as covering.   Patient aware provider will review message and nurse will return call if any additional advice or change of disposition.

## 2014-07-04 ENCOUNTER — Telehealth: Payer: Self-pay | Admitting: Obstetrics and Gynecology

## 2014-07-04 NOTE — Telephone Encounter (Signed)
Patient called and cancelled her appointment for "possible surgery/unable to conceive" with Dr. Quincy Simmonds due to work. She states she will call back to reschedule.

## 2014-07-04 NOTE — Telephone Encounter (Signed)
Encounter closed.  Ok to remove from inbox.

## 2014-07-04 NOTE — Telephone Encounter (Signed)
Routing update to Dr. Sabra Heck as covering.  Patient called 07/01/14 with request for anxiety medication and stated she would follow up with urgent care for treatment.  Patient then requested appointment for consult and plan to discuss with Dr. Quincy Simmonds possible laparoscopy for evaluation and treatment of pelvic pain and endometriosis. Patient has cancelled this consult and as stated below she would call back to reschedule.   Okay to close?

## 2014-07-15 ENCOUNTER — Institutional Professional Consult (permissible substitution): Payer: Self-pay | Admitting: Obstetrics and Gynecology

## 2014-10-07 ENCOUNTER — Telehealth: Payer: Self-pay | Admitting: Nurse Practitioner

## 2014-10-07 NOTE — Telephone Encounter (Signed)
Patient is having some "serious problems" and would like to see [facility] if possible. Last seen 12/06/13.

## 2014-10-07 NOTE — Telephone Encounter (Signed)
Spoke with patient. She states she continues to have back pain in which she contributes to her cycles. Patient states she was referred to orthopedics by our office but she has been busy with full time work and has not followed up. Referral notes in [facility] note that patient cancelled appointment for orthopedic referral to Dr. Jacqulyn Bath, confirmed with Dr. Simonne Come office that patient did not reschedule.  Patient states that she took a home pregnancy test on 09/28/14 right before her period started that day. She states that her cycles was heavy, changing pad q hour with golf ball sized clots. No bleeding today, just spotting only. Patient states she is actively trying to conceive. Patient states her back pain is at its worse right before her cycle, but pain continues with cycle and she reports pain with ovulation. Patient states that cycles are now impeding on social and work life and would like to have care.  Offered patient office visit with Dr. Talbert Nan for tomorrow at [place] and patient accepts. Patient will call back with any concerns prior to appointment or seek care at local Emergency room if condition worsens.  Routing to provider for final review. Patient agreeable to disposition. Will close encounter.

## 2014-10-08 ENCOUNTER — Ambulatory Visit (INDEPENDENT_AMBULATORY_CARE_PROVIDER_SITE_OTHER): Payer: BLUE CROSS/BLUE SHIELD | Admitting: Obstetrics and Gynecology

## 2014-10-08 ENCOUNTER — Encounter: Payer: Self-pay | Admitting: Obstetrics and Gynecology

## 2014-10-08 VITALS — BP 120/70 | HR 84 | Resp 16 | Wt 292.0 lb

## 2014-10-08 DIAGNOSIS — Z113 Encounter for screening for infections with a predominantly sexual mode of transmission: Secondary | ICD-10-CM

## 2014-10-08 DIAGNOSIS — G43109 Migraine with aura, not intractable, without status migrainosus: Secondary | ICD-10-CM | POA: Diagnosis not present

## 2014-10-08 DIAGNOSIS — Z3169 Encounter for other general counseling and advice on procreation: Secondary | ICD-10-CM

## 2014-10-08 DIAGNOSIS — Z23 Encounter for immunization: Secondary | ICD-10-CM | POA: Diagnosis not present

## 2014-10-08 DIAGNOSIS — M545 Low back pain, unspecified: Secondary | ICD-10-CM

## 2014-10-08 DIAGNOSIS — E669 Obesity, unspecified: Secondary | ICD-10-CM

## 2014-10-08 DIAGNOSIS — N939 Abnormal uterine and vaginal bleeding, unspecified: Secondary | ICD-10-CM

## 2014-10-08 DIAGNOSIS — N946 Dysmenorrhea, unspecified: Secondary | ICD-10-CM | POA: Diagnosis not present

## 2014-10-08 DIAGNOSIS — N920 Excessive and frequent menstruation with regular cycle: Secondary | ICD-10-CM

## 2014-10-08 DIAGNOSIS — N923 Ovulation bleeding: Secondary | ICD-10-CM

## 2014-10-08 DIAGNOSIS — L680 Hirsutism: Secondary | ICD-10-CM | POA: Diagnosis not present

## 2014-10-08 LAB — CBC
HCT: 36.7 % (ref 36.0–46.0)
Hemoglobin: 12.6 g/dL (ref 12.0–15.0)
MCH: 30.2 pg (ref 26.0–34.0)
MCHC: 34.3 g/dL (ref 30.0–36.0)
MCV: 88 fL (ref 78.0–100.0)
MPV: 10.4 fL (ref 8.6–12.4)
PLATELETS: 235 10*3/uL (ref 150–400)
RBC: 4.17 MIL/uL (ref 3.87–5.11)
RDW: 14.3 % (ref 11.5–15.5)
WBC: 6.8 10*3/uL (ref 4.0–10.5)

## 2014-10-08 LAB — TESTOSTERONE: TESTOSTERONE: 49 ng/dL (ref 10–70)

## 2014-10-08 LAB — LIPID PANEL
Cholesterol: 199 mg/dL (ref 125–200)
HDL: 31 mg/dL — ABNORMAL LOW (ref >=46)
LDL CALC: 126 mg/dL (ref <130)
Total CHOL/HDL Ratio: 6.4 Ratio — ABNORMAL HIGH (ref <=5.0)
Triglycerides: 212 mg/dL — ABNORMAL HIGH (ref <150)
VLDL: 42 mg/dL — AB (ref <30)

## 2014-10-08 LAB — FERRITIN: FERRITIN: 42 ng/mL (ref 10–291)

## 2014-10-08 LAB — TSH: TSH: 3.545 u[IU]/mL (ref 0.350–4.500)

## 2014-10-08 NOTE — Progress Notes (Signed)
Patient ID: Kathleen Sanders, female   DOB: 1990/12/04, 24 y.o.   MRN: [id number] GYNECOLOGY  VISIT   HPI: 24 y.o.   Married  Caucasian  female   G1P1001 with Patient's last menstrual period was 09/27/2014.   here to discuss her menstrual cycles. She has been having long heavy bleeding with severe cramping.  Menses q 32-35 days x 3-14 days. Bleeds for over 7 days every other month. Saturates a super tampon in less than an hour. Very heavy for a couple of days to a week. Sometimes she will bleed for up to a day a week prior to her cycle starting. She c/o sharp pains in her back, occurs a couple of times a month. The week prior to her cycle she will have lower back and side pain. The first few days of her cycle she has pain in her lower abdomen all the way around to her lower back. The back pain is sharp and severe, the pain in her abdomen is a stretchy, pressure feeling. She c/o sharp back pain for up to a week, starts about 2 weeks prior to her cycle. Normal bowel and bladder function. Sexually active, she has been trying to get pregnant since June of 2015.  Prior to trying to get pregnant she was on OCP's, cycles were more regular, but still heavy and still with back pain.  She has gained about 25-30 lbs in the last year. Son is 7, NSVD, unplanned (got pregnant on OCP's). Husband hasn't had any children. She saw an orthopedic physician, he didn't feel it was MS related.  She does c/o hirsutism on her face and stomach. It has gotten a little worse in the last few months.  She does report ovulation on predictor kits. Typically ovulates around day 16.   GYNECOLOGIC HISTORY: Patient's last menstrual period was 09/27/2014. Contraception: None Menopausal hormone therapy: N/A        OB History    Gravida Para Term Preterm AB TAB SAB Ectopic Multiple Living   1 1 1       1          Patient Active Problem List   Diagnosis Date Noted  . Migraine with aura   . Back pain 10/18/2013    Past Medical  History  Diagnosis Date  . Asthma   . Bipolar disorder   . OCD (obsessive compulsive disorder)   . Migraine with aura     History reviewed. No pertinent past surgical history.  Current Outpatient Prescriptions  Medication Sig Dispense Refill  . cloNIDine (CATAPRES) 0.1 MG tablet Take 1/2 tab twice a day for 3 days, then 1 tab twice a day    . clorazepate (TRANXENE) 3.75 MG tablet TAKE 1/2-1 TABLET BY MOUTH 2 TIMES A DAY  0  . Oxcarbazepine (TRILEPTAL) 300 MG tablet TAKE 1/2 TAB TWICE A DAY FOR A WEEK, THEN 1 TAB TWICE A DAY  1   No current facility-administered medications for this visit.     ALLERGIES: Latex  Family History  Problem Relation Age of Onset  . Colon cancer Maternal Grandmother   . Colon cancer Paternal Grandfather   . Prostate cancer Paternal Grandfather     also lung cancer  . Prostate cancer Father     Social History   Social History  . Marital Status: Married    Spouse Name: N/A  . Number of Children: 1  . Years of Education: N/A   Occupational History  . [employer]  Social History Main Topics  . Smoking status: Former Smoker -- 0.10 packs/day for 2 years    Quit date: 05/18/2012  . Smokeless tobacco: Never Used  . Alcohol Use: 0.0 oz/week    0 Standard drinks or equivalent per week     Comment: occ  . Drug Use: No  . Sexual Activity:    Partners: Male    Birth Control/ Protection: Pill   Other Topics Concern  . Not on file   Social History Narrative    Review of Systems  Gastrointestinal: Positive for abdominal pain.  Genitourinary:       Menstrual cycle problems Heavy cycles   All other systems reviewed and are negative.   PHYSICAL EXAMINATION:    BP 120/70 mmHg  Pulse 84  Resp 16  Wt 292 lb (132.45 kg)  LMP 09/27/2014    General appearance: alert, cooperative and appears stated age Skin: no hair noted on her face (she removes it), hair seen on her lower midline abdomen Neck: no adenopathy, supple, symmetrical, trachea  midline and thyroid normal to inspection and palpation Abdomen: soft, mildly tender BLQ, no rebound, no guarding, no masses Back: right lower back tender to palpation  Pelvic: External genitalia:  no lesions              Urethra:  normal appearing urethra with no masses, tenderness or lesions              Bartholins and Skenes: normal                 Vagina: normal appearing vagina with normal color and discharge, no lesions              Cervix: no lesions              Bimanual Exam:  Uterus: palpation limited by BMI, no CMT, uterus mildly tender, anteverted, no appreciably enlarged. No pelvic floor tenderness.               Adnexa: No masses, mild bilateral adnexal tenderness              Rectovaginal: Yes.  .  Confirms.              Anus:  normal sphincter tone, no lesions  Chaperone was present for exam.  ASSESSMENT Menorrhagia, intermenstrual spotting Obesity Hirsutism Infertility Severe back pain, patient states back pain is severe with ovulation and cycles. She was told her back pain was gyn related. She definitely is tender with mild palpation of her right lower back. Lower back pain when moving on the exam table. There is definitely a MS component to her pain that seems to be worsened at certain times of her cycle    PLAN Get a copy of her evaluation by Ortho TSH, CBC, Ferritin, HgbA1C, Lipids, Hirsutism labs, genprobe Return for a gyn ultrasound Discussed evaluation by physical therapy for her back pain Discussed weight loss, weight watchers and increased exercise She is taking OTC NSAIDS, not really helping, declines prescription NSAID's She is not a candidate for OCP's secondary to a h/o migraines with Auras and is trying to get pregnant Discussed semen analysis, husband without insurance BBT charts given TDAP (overdue), given   An After Visit Summary was printed and given to the patient.  40 minutes face to face time of which over 50% was spent in counseling.

## 2014-10-09 LAB — DHEA-SULFATE: DHEA SO4: 223 ug/dL (ref 18–391)

## 2014-10-09 LAB — GC/CHLAMYDIA PROBE AMP
CT Probe RNA: NEGATIVE
GC Probe RNA: NEGATIVE

## 2014-10-09 LAB — HEMOGLOBIN A1C
HEMOGLOBIN A1C: 5.4 % (ref <5.7)
Mean Plasma Glucose: 108 mg/dL (ref <117)

## 2014-10-11 LAB — 17-HYDROXYPROGESTERONE: 17-OH-PROGESTERONE, LC/MS/MS: 35 ng/dL

## 2014-10-14 ENCOUNTER — Encounter: Payer: Self-pay | Admitting: Obstetrics and Gynecology

## 2014-10-15 ENCOUNTER — Telehealth: Payer: Self-pay

## 2014-10-15 NOTE — Telephone Encounter (Signed)
Please inform the patient that the 17 hydroxyprogesterone was drawn secondary to her c/o hirsutism. If elevated it is suggestive of nonclassic congenital adrenal hyperplasia. Her test was normal.

## 2014-10-15 NOTE — Telephone Encounter (Signed)
Dr.Jertson, patient is requesting further details regarding 17 Hydroxyprogesterone level which was 35 on 10/08/2014. Please advise.

## 2014-10-15 NOTE — Telephone Encounter (Signed)
Telephone encounter created for Dr.Jertson's review.

## 2014-10-15 NOTE — Telephone Encounter (Signed)
Non-Urgent Medical Question  Message [phone]   From  [place]   To  Salvadore Dom, MD   Sent  10/14/2014 11:38 AM     I have a question about 17 HYDROXYPROGESTERONE resulted on 10/11/14 at 11:55 PM.   I do not understand these results.      Responsible Party    Pool - [facility] Clinical Pool No one has taken responsibility for this message.     No actions have been taken on this message.

## 2014-10-15 NOTE — Telephone Encounter (Signed)
Spoke with patient. Advised of message as seen below from Dr.Jertson. Patient is agreeable and verbalizes understanding.  Routing to Thorsten Climer for final review. Patient agreeable to disposition. Will close encounter.  

## 2014-10-16 NOTE — Telephone Encounter (Signed)
Notes Recorded by Nicholes Rough, CMA on 10/16/2014 at 10:47 AM I have tried to call patient several times but I'm unable to leave a VM due to her VM not being set up. She has seen her lab results via mychart. Do I need to send her a letter? Notes Recorded by Nicholes Rough, CMA on 10/14/2014 at 11:11 AM I tried to leave patient a message however her voice mail box is not set up. Patient has seen the results via mychart- eh Notes Recorded by Salvadore Dom, MD on 10/14/2014 at 10:56 AM Please inform the patient that her lipid panel was abnormal which increases her risk of heart diease in the future. She needs to have a primary MD to follow her, if she doesn't have one please give her some names. We will want to send a copy of her labs to her primary. She should be on a diet low in saturated fats and exercise regularly. She could consider going to the medical weight loss clinic for help.  The rest of her labs were normal.

## 2014-10-16 NOTE — Telephone Encounter (Signed)
Patient is returning call to Starlyn Skeans, [place] regarding results as seen below. Routing to [employer], [place].  Encounter previously closed.

## 2014-10-16 NOTE — Telephone Encounter (Signed)
Patient returning your call.

## 2014-10-17 NOTE — Telephone Encounter (Signed)
Please see telephone encounter dated 10/15/2014.  Routing to Kathleen Sanders for final review. Patient agreeable to disposition. Will close encounter.

## 2014-10-25 ENCOUNTER — Telehealth: Payer: Self-pay | Admitting: Obstetrics and Gynecology

## 2014-10-25 NOTE — Telephone Encounter (Signed)
Spoke with patient regarding benefit for PUS. Patient understands but has concerns. States if provider "feels this is absolutely necessary I'll figure it out". Patient confirms she is still having the low back pain. Patient advised I will discuss benefits with administrator and return call. Patient agreeable to call from nurse to discuss need for testing.

## 2014-10-30 ENCOUNTER — Telehealth: Payer: Self-pay | Admitting: Obstetrics and Gynecology

## 2014-10-30 NOTE — Telephone Encounter (Signed)
Call to patient.  Office visit with Dr. Talbert Nan 10/08/14.  Discussed painful and heavy cycles. GYN ultrasound ordered.  Unable to leave voicemail. Voicemail not set up yet.  My chart message sent to ask patient to call the office.

## 2014-10-30 NOTE — Telephone Encounter (Signed)
Reviewed records from her Orthopedic evaluation from 11/15. Diagnosis was midline low back pain with right sided sciatica. Mild disc space narrowing at L4 5, could be causing her symptoms. She was given NSAID's, muscle relaxant and PT was recommended. She was supposed to f/u in 4 weeks. I believe her back pain is MS and not gyn related.  I do think she still needs the gyn ultrasound for her abnormal uterine bleeding. Please inform.

## 2014-10-30 NOTE — Telephone Encounter (Signed)
Dr. Talbert Nan sent separate message after reviewing records from orthopedics. Will close this encounter.

## 2014-10-30 NOTE — Telephone Encounter (Signed)
Call Documentation      Salvadore Dom, MD at 10/30/2014 4:12 PM    Status: Signed      Expand All Collapse All    Reviewed records from her Orthopedic evaluation from 11/15. Diagnosis was midline low back pain with right sided sciatica. Mild disc space narrowing at L4 5, could be causing her symptoms. She was given NSAID's, muscle relaxant and PT was recommended. She was supposed to f/u in 4 weeks. I believe her back pain is MS and not gyn related.  I do think she still needs the gyn ultrasound for her abnormal uterine bleeding. Please inform.

## 2014-10-30 NOTE — Telephone Encounter (Signed)
Called patient today and no voicemail. Also sent mychart message. Previous telephone message dated 10/25/14.

## 2014-10-31 NOTE — Telephone Encounter (Signed)
Return call to patient, no voicemail. No answer.   Please have patient hold for any nurse at time of return call.

## 2014-10-31 NOTE — Telephone Encounter (Signed)
Patient returning call.

## 2014-10-31 NOTE — Telephone Encounter (Signed)
Patient returned call. She is given message from Dr. Talbert Nan. She would like to have ultrasound but will need time to make payment arrangements. She requests to schedule ultrasound for end of November and appointment is scheduled for 12/17/14 at 1530 with consult at 1600.  Advised patient she would be contacted by insurance coordinator to discuss payment. Patient agreeable.  Routing to provider for final review. Patient agreeable to disposition. Will close encounter.   cc Kerry Hough for insurance pre-certification and patient contact.

## 2014-10-31 NOTE — Telephone Encounter (Signed)
Called patient again, no voicemail.

## 2014-11-04 ENCOUNTER — Telehealth: Payer: Self-pay | Admitting: Obstetrics and Gynecology

## 2014-11-04 NOTE — Telephone Encounter (Signed)
Called patient to review benefits for procedure. Unable to leave voicemail as voicemail was not set up yet.

## 2014-11-05 ENCOUNTER — Telehealth: Payer: Self-pay

## 2014-11-05 DIAGNOSIS — M545 Low back pain, unspecified: Secondary | ICD-10-CM

## 2014-11-05 DIAGNOSIS — N946 Dysmenorrhea, unspecified: Secondary | ICD-10-CM

## 2014-11-05 NOTE — Telephone Encounter (Signed)
-----   Message from Elenore Paddy sent at 11/04/2014  3:09 PM EDT ----- Regarding: orders Patient scheduled for PUS in November. Please enter order to link.  Thanks!  Becky

## 2014-11-05 NOTE — Telephone Encounter (Signed)
New order placed and linked to appointment.  JE:[id number] Kathleen Sanders  Routing to provider for final review. Patient agreeable to disposition. Will close encounter.

## 2014-12-16 ENCOUNTER — Ambulatory Visit (INDEPENDENT_AMBULATORY_CARE_PROVIDER_SITE_OTHER): Payer: BLUE CROSS/BLUE SHIELD | Admitting: Nurse Practitioner

## 2014-12-16 ENCOUNTER — Encounter: Payer: Self-pay | Admitting: Nurse Practitioner

## 2014-12-16 VITALS — BP 122/74 | HR 84 | Ht 66.0 in | Wt 293.0 lb

## 2014-12-16 DIAGNOSIS — Z01419 Encounter for gynecological examination (general) (routine) without abnormal findings: Secondary | ICD-10-CM | POA: Diagnosis not present

## 2014-12-16 DIAGNOSIS — N926 Irregular menstruation, unspecified: Secondary | ICD-10-CM

## 2014-12-16 NOTE — Progress Notes (Signed)
Patient ID: Kathleen Sanders, female   DOB: [birth year], 24 y.o.   MRN: [id number] 24 y.o. G55P1001 Married  Caucasian Fe here for annual exam.  Off OCP since 06/2013.  Menses is 32 - 40 day.  Last 7-10 days.  Heavy for 3-4 days.  Super pad and tampon changing every 45- 60 minutes.  She has been seen recently by Dr. Talbert Sanders and will be having PUS tomorrow.  Trying for pregnancy since last June. She is doing home BBT kits but unable to tell anything because her temp goes up and down all the time. She does feel some stress as her husband was just laid off from work.  She is reluctant to add him to her insurance plan due to cost.  Patient's last menstrual period was 12/09/2014 (exact date).          Sexually active: Yes.    The current method of family planning is none. Trying to conceive. Exercising: No.  The patient does not participate in regular exercise at present. Smoker:  no  Health Maintenance: Pap: 12/06/13, Negative  TDaP: 10/08/14 Gardasil: 2 injection completed in 2008 Labs:  Urine:  Moderate RBC (on menses)   reports that she quit smoking about 2 years ago. She has never used smokeless tobacco. She reports that she drinks alcohol. She reports that she does not use illicit drugs.  Past Medical History  Diagnosis Date  . Asthma   . Bipolar disorder ([place])   . OCD (obsessive compulsive disorder)   . Migraine with aura     History reviewed. No pertinent past surgical history.  Current Outpatient Prescriptions  Medication Sig Dispense Refill  . carbamazepine (TEGRETOL) 200 MG tablet Take 200 mg by mouth 3 (three) times daily.  1  . clonazePAM (KLONOPIN) 1 MG tablet Take 1 tablet by mouth 3 (three) times daily.  0   No current facility-administered medications for this visit.    Family History  Problem Relation Age of Onset  . Colon cancer Maternal Grandmother   . Colon cancer Paternal Grandfather   . Prostate cancer Paternal Grandfather     also lung cancer  . Prostate cancer  Father     ROS:  Pertinent items are noted in HPI.  Otherwise, a comprehensive ROS was negative.  Exam:   BP 122/74 mmHg  Pulse 84  Ht 5\' 6"  (1.676 m)  Wt 293 lb (132.904 kg)  BMI 47.31 kg/m2  LMP 12/09/2014 (Exact Date) Height: 5\' 6"  (167.6 cm) Ht Readings from Last 3 Encounters:  12/16/14 5\' 6"  (1.676 m)  04/22/14 5\' 6"  (1.676 m)  12/06/13 5' 6.75" (1.695 m)    General appearance: alert, cooperative and appears stated age Head: Normocephalic, without obvious abnormality, atraumatic Neck: no adenopathy, supple, symmetrical, trachea midline and thyroid normal to inspection and palpation Lungs: clear to auscultation bilaterally Breasts: normal appearance, no masses or tenderness Heart: regular rate and rhythm Abdomen: soft, non-tender; no masses,  no organomegaly Extremities: extremities normal, atraumatic, no cyanosis or edema Skin: Skin color, texture, turgor normal. No rashes or lesions Lymph nodes: Cervical, supraclavicular, and axillary nodes normal. No abnormal inguinal nodes palpated Neurologic: Grossly normal   Pelvic: External genitalia:  no lesions              Urethra:  normal appearing urethra with no masses, tenderness or lesions              Bartholin's and Skene's: normal  Vagina: normal appearing vagina with normal color and discharge, no lesions              Cervix: anteverted              Pap taken: No. Bimanual Exam:  Uterus:  normal size, contour, position, consistency, mobility, non-tender              Adnexa: no mass, fullness, tenderness               Rectovaginal: Confirms               Anus:  normal sphincter tone, no lesions  Chaperone present: yes  A:  Well Woman with normal exam  Now trying for pregnancy X 1 year History of back pain   History of irregular menses off OCP  Possible PCOS  History of migraine HA's   P:   Reviewed health and wellness pertinent to exam  Pap smear as above  Will follow with Dr.  Talbert Sanders tomorrow  She needs to continue prenatal MVI  Counseled on breast self exam, adequate intake of calcium and vitamin D, diet and exercise return annually or prn  An After Visit Summary was printed and given to the patient.

## 2014-12-16 NOTE — Patient Instructions (Signed)
General topics  Next pap or exam is  due in 1 year Take a Women's multivitamin Take 1200 mg. of calcium daily - prefer dietary If any concerns in interim to call back  Breast Self-Awareness Practicing breast self-awareness may pick up problems early, prevent significant medical complications, and possibly save your life. By practicing breast self-awareness, you can become familiar with how your breasts look and feel and if your breasts are changing. This allows you to notice changes early. It can also offer you some reassurance that your breast health is good. One way to learn what is normal for your breasts and whether your breasts are changing is to do a breast self-exam. If you find a lump or something that was not present in the past, it is best to contact your caregiver right away. Other findings that should be evaluated by your caregiver include nipple discharge, especially if it is bloody; skin changes or reddening; areas where the skin seems to be pulled in (retracted); or new lumps and bumps. Breast pain is seldom associated with cancer (malignancy), but should also be evaluated by a caregiver. BREAST SELF-EXAM The best time to examine your breasts is 5 7 days after your menstrual period is over.  ExitCare Patient Information 2013 [employer].   Exercise to Stay Healthy Exercise helps you become and stay healthy. EXERCISE IDEAS AND TIPS Choose exercises that:  You enjoy.  Fit into your day. You do not need to exercise really hard to be healthy. You can do exercises at a slow or medium level and stay healthy. You can:  Stretch before and after working out.  Try yoga, Pilates, or tai chi.  Lift weights.  Walk fast, swim, jog, run, climb stairs, bicycle, dance, or rollerskate.  Take aerobic classes. Exercises that burn about 150 calories:  Running 1  miles in 15 minutes.  Playing volleyball for 45 to 60 minutes.  Washing and waxing a car for 45 to 60  minutes.  Playing touch football for 45 minutes.  Walking 1  miles in 35 minutes.  Pushing a stroller 1  miles in 30 minutes.  Playing basketball for 30 minutes.  Raking leaves for 30 minutes.  Bicycling 5 miles in 30 minutes.  Walking 2 miles in 30 minutes.  Dancing for 30 minutes.  Shoveling snow for 15 minutes.  Swimming laps for 20 minutes.  Walking up stairs for 15 minutes.  Bicycling 4 miles in 15 minutes.  Gardening for 30 to 45 minutes.  Jumping rope for 15 minutes.  Washing windows or floors for 45 to 60 minutes. Document Released: 02/06/2010 Document Revised: 03/29/2011 Document Reviewed: 02/06/2010 Exit[employer] Patient Information 2013 [employer].   Other topics ( that may be useful information):    Sexually Transmitted Disease Sexually transmitted disease (STD) refers to any infection that is passed from person to person during sexual activity. This may happen by way of saliva, semen, blood, vaginal mucus, or urine. Common STDs include:  Gonorrhea.  Chlamydia.  Syphilis.  HIV/AIDS.  Genital herpes.  Hepatitis B and C.  Trichomonas.  Human papillomavirus (HPV).  Pubic lice. CAUSES  An STD may be spread by bacteria, virus, or parasite. A person can get an STD by:  Sexual intercourse with an infected person.  Sharing sex toys with an infected person.  Sharing needles with an infected person.  Having intimate contact with the genitals, mouth, or rectal areas of an infected person. SYMPTOMS  Some people may not have any symptoms, but   they can still pass the infection to others. Different STDs have different symptoms. Symptoms include:  Painful or bloody urination.  Pain in the pelvis, abdomen, vagina, anus, throat, or eyes.  Skin rash, itching, irritation, growths, or sores (lesions). These usually occur in the genital or anal area.  Abnormal vaginal discharge.  Penile discharge in men.  Soft, flesh-colored skin growths in the  genital or anal area.  Fever.  Pain or bleeding during sexual intercourse.  Swollen glands in the groin area.  Yellow skin and eyes (jaundice). This is seen with hepatitis. DIAGNOSIS  To make a diagnosis, your caregiver may:  Take a medical history.  Perform a physical exam.  Take a specimen (culture) to be examined.  Examine a sample of discharge under a microscope.  Perform blood test TREATMENT   Chlamydia, gonorrhea, trichomonas, and syphilis can be cured with antibiotic medicine.  Genital herpes, hepatitis, and HIV can be treated, but not cured, with prescribed medicines. The medicines will lessen the symptoms.  Genital warts from HPV can be treated with medicine or by freezing, burning (electrocautery), or surgery. Warts may come back.  HPV is a virus and cannot be cured with medicine or surgery.However, abnormal areas may be followed very closely by your caregiver and may be removed from the cervix, vagina, or vulva through office procedures or surgery. If your diagnosis is confirmed, your recent sexual partners need treatment. This is true even if they are symptom-free or have a negative culture or evaluation. They should not have sex until their caregiver says it is okay. HOME CARE INSTRUCTIONS  All sexual partners should be informed, tested, and treated for all STDs.  Take your antibiotics as directed. Finish them even if you start to feel better.  Only take over-the-counter or prescription medicines for pain, discomfort, or fever as directed by your caregiver.  Rest.  Eat a balanced diet and drink enough fluids to keep your urine clear or pale yellow.  Do not have sex until treatment is completed and you have followed up with your caregiver. STDs should be checked after treatment.  Keep all follow-up appointments, Pap tests, and blood tests as directed by your caregiver.  Only use latex condoms and water-soluble lubricants during sexual activity. Do not use  petroleum jelly or oils.  Avoid alcohol and illegal drugs.  Get vaccinated for HPV and hepatitis. If you have not received these vaccines in the past, talk to your caregiver about whether one or both might be right for you.  Avoid risky sex practices that can break the skin. The only way to avoid getting an STD is to avoid all sexual activity.Latex condoms and dental dams (for oral sex) will help lessen the risk of getting an STD, but will not completely eliminate the risk. SEEK MEDICAL CARE IF:   You have a fever.  You have any new or worsening symptoms. Document Released: 03/27/2002 Document Revised: 03/29/2011 Document Reviewed: 04/03/2010 [facility] Patient Information 2013 [place].    Domestic Abuse You are being battered or abused if someone close to you hits, pushes, or physically hurts you in any way. You also are being abused if you are forced into activities. You are being sexually abused if you are forced to have sexual contact of any kind. You are being emotionally abused if you are made to feel worthless or if you are constantly threatened. It is important to remember that help is available. No one has the right to abuse you. PREVENTION OF FURTHER  ABUSE  Learn the warning signs of danger. This varies with situations but may include: the use of alcohol, threats, isolation from friends and family, or forced sexual contact. Leave if you feel that violence is going to occur.  If you are attacked or beaten, report it to the police so the abuse is documented. You do not have to press charges. The police can protect you while you or the attackers are leaving. Get the officer's name and badge number and a copy of the report.  Find someone you can trust and tell them what is happening to you: your caregiver, a nurse, clergy member, close friend or family member. Feeling ashamed is natural, but remember that you have done nothing wrong. No one deserves abuse. Document Released:  01/02/2000 Document Revised: 03/29/2011 Document Reviewed: 03/12/2010 ExitCare Patient Information 2013 [employer].    How Much is Too Much Alcohol? Drinking too much alcohol can cause injury, accidents, and health problems. These types of problems can include:   Car crashes.  Falls.  Family fighting (domestic violence).  Drowning.  Fights.  Injuries.  Burns.  Damage to certain organs.  Having a baby with birth defects. ONE DRINK CAN BE TOO MUCH WHEN YOU ARE:  Working.  Pregnant or breastfeeding.  Taking medicines. Ask your doctor.  Driving or planning to drive. If you or someone you know has a drinking problem, get help from a doctor.  Document Released: 10/31/2008 Document Revised: 03/29/2011 Document Reviewed: 10/31/2008 ExitCare Patient Information 2013 [employer].   Smoking Hazards Smoking cigarettes is extremely bad for your health. Tobacco smoke has over 200 known poisons in it. There are over 60 chemicals in tobacco smoke that cause cancer. Some of the chemicals found in cigarette smoke include:   Cyanide.  Benzene.  Formaldehyde.  Methanol (wood alcohol).  Acetylene (fuel used in welding torches).  Ammonia. Cigarette smoke also contains the poisonous gases nitrogen oxide and carbon monoxide.  Cigarette smokers have an increased risk of many serious medical problems and Smoking causes approximately:  90% of all lung cancer deaths in men.  80% of all lung cancer deaths in women.  90% of deaths from chronic obstructive lung disease. Compared with nonsmokers, smoking increases the risk of:  Coronary heart disease by 2 to 4 times.  Stroke by 2 to 4 times.  Men developing lung cancer by 23 times.  Women developing lung cancer by 13 times.  Dying from chronic obstructive lung diseases by 12 times.  . Smoking is the most preventable cause of death and disease in our society.  WHY IS SMOKING ADDICTIVE?  Nicotine is the chemical  agent in tobacco that is capable of causing addiction or dependence.  When you smoke and inhale, nicotine is absorbed rapidly into the bloodstream through your lungs. Nicotine absorbed through the lungs is capable of creating a powerful addiction. Both inhaled and non-inhaled nicotine may be addictive.  Addiction studies of cigarettes and spit tobacco show that addiction to nicotine occurs mainly during the teen years, when young people begin using tobacco products. WHAT ARE THE BENEFITS OF QUITTING?  There are many health benefits to quitting smoking.   Likelihood of developing cancer and heart disease decreases. Health improvements are seen almost immediately.  Blood pressure, pulse rate, and breathing patterns start returning to normal soon after quitting. QUITTING SMOKING   American Lung Association - 1-800-LUNGUSA  American Cancer Society - 1-800-ACS-2345 Document Released: 02/12/2004 Document Revised: 03/29/2011 Document Reviewed: 10/16/2008 ExitCare Patient Information 2013 ExitCare,   LL[employer].   Stress Management Stress is a state of physical or mental tension that often results from changes in your life or normal routine. Some common causes of stress are:  Death of a loved one.  Injuries or severe illnesses.  Getting fired or changing jobs.  Moving into a new home. Other causes may be:  Sexual problems.  Business or financial losses.  Taking on a large debt.  Regular conflict with someone at home or at work.  Constant tiredness from lack of sleep. It is not just bad things that are stressful. It may be stressful to:  Win the lottery.  Get married.  Buy a new car. The amount of stress that can be easily tolerated varies from person to person. Changes generally cause stress, regardless of the types of change. Too much stress can affect your health. It may lead to physical or emotional problems. Too little stress (boredom) may also become stressful. SUGGESTIONS TO  REDUCE STRESS:  Talk things over with your family and friends. It often is helpful to share your concerns and worries. If you feel your problem is serious, you may want to get help from a professional counselor.  Consider your problems one at a time instead of lumping them all together. Trying to take care of everything at once may seem impossible. List all the things you need to do and then start with the most important one. Set a goal to accomplish 2 or 3 things each day. If you expect to do too many in a single day you will naturally fail, causing you to feel even more stressed.  Do not use alcohol or drugs to relieve stress. Although you may feel better for a short time, they do not remove the problems that caused the stress. They can also be habit forming.  Exercise regularly - at least 3 times per week. Physical exercise can help to relieve that "uptight" feeling and will relax you.  The shortest distance between despair and hope is often a good night's sleep.  Go to bed and get up on time allowing yourself time for appointments without being rushed.  Take a short "time-out" period from any stressful situation that occurs during the day. Close your eyes and take some deep breaths. Starting with the muscles in your face, tense them, hold it for a few seconds, then relax. Repeat this with the muscles in your neck, shoulders, hand, stomach, back and legs.  Take good care of yourself. Eat a balanced diet and get plenty of rest.  Schedule time for having fun. Take a break from your daily routine to relax. HOME CARE INSTRUCTIONS   Call if you feel overwhelmed by your problems and feel you can no longer manage them on your own.  Return immediately if you feel like hurting yourself or someone else. Document Released: 06/30/2000 Document Revised: 03/29/2011 Document Reviewed: 02/20/2007 ExitCare Patient Information 2013 [employer].  

## 2014-12-17 ENCOUNTER — Encounter: Payer: Self-pay | Admitting: Obstetrics and Gynecology

## 2014-12-17 ENCOUNTER — Ambulatory Visit (INDEPENDENT_AMBULATORY_CARE_PROVIDER_SITE_OTHER): Payer: BLUE CROSS/BLUE SHIELD

## 2014-12-17 ENCOUNTER — Ambulatory Visit (INDEPENDENT_AMBULATORY_CARE_PROVIDER_SITE_OTHER): Payer: BLUE CROSS/BLUE SHIELD | Admitting: Obstetrics and Gynecology

## 2014-12-17 ENCOUNTER — Encounter: Payer: Self-pay | Admitting: Nurse Practitioner

## 2014-12-17 VITALS — BP 122/60 | HR 80 | Resp 16 | Wt 295.0 lb

## 2014-12-17 DIAGNOSIS — N920 Excessive and frequent menstruation with regular cycle: Secondary | ICD-10-CM

## 2014-12-17 DIAGNOSIS — N946 Dysmenorrhea, unspecified: Secondary | ICD-10-CM

## 2014-12-17 DIAGNOSIS — Z3169 Encounter for other general counseling and advice on procreation: Secondary | ICD-10-CM | POA: Diagnosis not present

## 2014-12-17 DIAGNOSIS — M545 Low back pain, unspecified: Secondary | ICD-10-CM

## 2014-12-17 DIAGNOSIS — E785 Hyperlipidemia, unspecified: Secondary | ICD-10-CM | POA: Diagnosis not present

## 2014-12-17 NOTE — Progress Notes (Signed)
GYNECOLOGY  VISIT   HPI: 24 y.o.   Married  Caucasian  female   G1P1001 with Patient's last menstrual period was 12/09/2014 (exact date).   here for evaluation of menorrhagia and dysmenorrhea. She would also like to get pregnant.  Partner has a h/o a pregnancy with a prior partner 4 years ago.  She has done BBT charts, forgot to bring them with. She states her temp goes up and down. She is taking her temp prior to getting out of bed.  No h/o STD's, unprotected intercourse since June of 2015.  GYNECOLOGIC HISTORY: Patient's last menstrual period was 12/09/2014 (exact date). Contraception:None Menopausal hormone therapy: N/A        OB History    Gravida Para Term Preterm AB TAB SAB Ectopic Multiple Living   1 1 1  0 0 0 0 0 0 1         Patient Active Problem List   Diagnosis Date Noted  . Migraine with aura   . Back pain 10/18/2013    Past Medical History  Diagnosis Date  . Asthma   . Bipolar disorder ([place])   . OCD (obsessive compulsive disorder)   . Migraine with aura     History reviewed. No pertinent past surgical history.  Current Outpatient Prescriptions  Medication Sig Dispense Refill  . carbamazepine (TEGRETOL) 200 MG tablet Take 200 mg by mouth 3 (three) times daily.  1  . clonazePAM (KLONOPIN) 1 MG tablet Take 1 tablet by mouth 3 (three) times daily.  0   No current facility-administered medications for this visit.     ALLERGIES: Latex  Family History  Problem Relation Age of Onset  . Colon cancer Maternal Grandmother   . Colon cancer Paternal Grandfather   . Prostate cancer Paternal Grandfather     also lung cancer  . Prostate cancer Father     Social History   Social History  . Marital Status: Married    Spouse Name: N/A  . Number of Children: 1  . Years of Education: N/A   Occupational History  . [employer]    Social History Main Topics  . Smoking status: Former Smoker -- 0.10 packs/day for 2 years    Quit date: 05/18/2012  . Smokeless  tobacco: Never Used  . Alcohol Use: 0.0 oz/week    0 Standard drinks or equivalent per week     Comment: occ  . Drug Use: No  . Sexual Activity:    Partners: Male    Birth Control/ Protection: None   Other Topics Concern  . Not on file   Social History Narrative    Review of Systems  Genitourinary:       Heavy menstrual bleeding   All other systems reviewed and are negative.   PHYSICAL EXAMINATION:    BP 122/60 mmHg  Pulse 80  Resp 16  Wt 295 lb (133.811 kg)  LMP 12/09/2014 (Exact Date)    General appearance: alert, cooperative and appears stated age  ASSESSMENT Menorrhagia, normal CBC, normal TSH, normal ultrasound Dysmenorrhea Infertility Suspect PCOs, overweight, hirsutism, one ovary with PCO appearance. Per patient sounds like she isn't ovulating. H/O elevated lipids    PLAN Send BBT charts for review Discussed SA, he can't afford it right now, previous partner got pregnant Menstrual calender More BBT charts given F/U with primary for abnormal lipids    An After Visit Summary was printed and given to the patient.  15 minutes face to face time of  which over 50% was spent in counseling.

## 2014-12-17 NOTE — Patient Instructions (Signed)
Polycystic Ovarian Syndrome  Polycystic ovarian syndrome (PCOS) is a common hormonal disorder among women of reproductive age. Most women with PCOS grow many small cysts on their ovaries. PCOS can cause problems with your periods and make it difficult to get pregnant. It can also cause an increased risk of miscarriage with pregnancy. If left untreated, PCOS can lead to serious health problems, such as diabetes and heart disease.  CAUSES  The cause of PCOS is not fully understood, but genetics may be a factor.  SIGNS AND SYMPTOMS   · Infrequent or no menstrual periods.    · Inability to get pregnant (infertility) because of not ovulating.    · Increased growth of hair on the face, chest, stomach, back, thumbs, thighs, or toes.    · Acne, oily skin, or dandruff.    · Pelvic pain.    · Weight gain or obesity, usually carrying extra weight around the waist.    · Type 2 diabetes.     · High cholesterol.    · High blood pressure.    · Female-pattern baldness or thinning hair.    · Patches of thickened and dark brown or black skin on the neck, arms, breasts, or thighs.    · Tiny excess flaps of skin (skin tags) in the armpits or neck area.    · Excessive snoring and having breathing stop at times while asleep (sleep apnea).    · Deepening of the voice.    · Gestational diabetes when pregnant.    DIAGNOSIS   There is no single test to diagnose PCOS.   · Your health care provider will:      Take a medical history.      Perform a pelvic exam.      Have ultrasonography done.      Check your female and female hormone levels.      Measure glucose or sugar levels in the blood.      Do other blood tests.    · If you are producing too many female hormones, your health care provider will make sure it is from PCOS. At the physical exam, your health care provider will want to evaluate the areas of increased hair growth. Try to allow natural hair growth for a few days before the visit.    · During a pelvic exam, the ovaries may be enlarged  or swollen because of the increased number of small cysts. This can be seen more easily by using vaginal ultrasonography or screening to examine the ovaries and lining of the uterus (endometrium) for cysts. The uterine lining may become thicker if you have not been having a regular period.    TREATMENT   Because there is no cure for PCOS, it needs to be managed to prevent problems. Treatments are based on your symptoms. Treatment is also based on whether you want to have a baby or whether you need contraception.   Treatment may include:   · Progesterone hormone to start a menstrual period.    · Birth control pills to make you have regular menstrual periods.    · Medicines to make you ovulate, if you want to get pregnant.    · Medicines to control your insulin.    · Medicine to control your blood pressure.    · Medicine and diet to control your high cholesterol and triglycerides in your blood.  · Medicine to reduce excessive hair growth.   · Surgery, making small holes in the ovary, to decrease the amount of female hormone production. This is done through a long, lighted tube (laparoscope) placed into the pelvis through a tiny incision in the lower abdomen.      HOME CARE INSTRUCTIONS  · Only take over-the-counter or prescription medicine as directed by your health care provider.  · Pay attention to the foods you eat and your activity levels. This can help reduce the effects of PCOS.    Keep your weight under control.    Eat foods that are low in carbohydrate and high in fiber.    Exercise regularly.  SEEK MEDICAL CARE IF:  · Your symptoms do not get better with medicine.  · You have new symptoms.     This information is not intended to replace advice given to you by your health care provider. Make sure you discuss any questions you have with your health care provider.     Document Released: 04/30/2004 Document Revised: 10/25/2012 Document Reviewed: 06/22/2012  Elsevier Interactive Patient Education ©2016 [employer]vier  Inc.

## 2014-12-18 ENCOUNTER — Encounter: Payer: Self-pay | Admitting: Obstetrics and Gynecology

## 2014-12-18 NOTE — Progress Notes (Signed)
Encounter reviewed by Dr. Brook Amundson C. Silva.  

## 2015-01-16 ENCOUNTER — Telehealth: Payer: Self-pay | Admitting: Obstetrics and Gynecology

## 2015-01-16 NOTE — Telephone Encounter (Signed)
Please let the patient know that I reviewed the BBT charts she sent and they are not cw ovulation. Please have her come in and bring her most recent BBT chart, we should discuss clomid.

## 2015-01-17 NOTE — Telephone Encounter (Signed)
Spoke with patient and she is given message from Dr. Talbert Nan. She is agreeable and appointment scheduled for 01/23/15 with Dr. Talbert Nan.

## 2015-01-23 ENCOUNTER — Ambulatory Visit (INDEPENDENT_AMBULATORY_CARE_PROVIDER_SITE_OTHER): Payer: BLUE CROSS/BLUE SHIELD | Admitting: Obstetrics and Gynecology

## 2015-01-23 ENCOUNTER — Encounter: Payer: Self-pay | Admitting: Obstetrics and Gynecology

## 2015-01-23 VITALS — BP 128/70 | HR 84 | Resp 16 | Wt 297.0 lb

## 2015-01-23 DIAGNOSIS — N970 Female infertility associated with anovulation: Secondary | ICD-10-CM | POA: Diagnosis not present

## 2015-01-23 MED ORDER — MEDROXYPROGESTERONE ACETATE 5 MG PO TABS: ORAL_TABLET | ORAL | Status: DC

## 2015-01-23 MED ORDER — CLOMIPHENE CITRATE 50 MG PO TABS: 50.0000 mg | ORAL_TABLET | Freq: Every day | ORAL | Status: DC

## 2015-01-23 NOTE — Progress Notes (Signed)
Patient ID: Kathleen Sanders, female   DOB: Feb 05, 1990, 25 y.o.   MRN: [id number] GYNECOLOGY  VISIT   HPI: 25 y.o.   Married  Caucasian  female   G1P1001 with Patient's last menstrual period was 01/09/2015.   Here to discuss clomid injections. She has been trying to get pregnant for a year and a half. She is bipolar (type 2, mainly depression) she is on Tegretol and Klonopin. She is seeing a psychiatrist. She has talked to her psychiatrist about getting off of these medications.  Partner hasn't had a semen analysis yet. He is willing to do it. He has gotten prior partner pregnant.  She is having cycles every 26-30+ days. 3 months of BBT charts are not c/w ovulation.  Prior normal TSH, she denies galactorrhea.  GYNECOLOGIC HISTORY: Patient's last menstrual period was 01/09/2015. Contraception:None Menopausal hormone therapy: None        OB History    Gravida Para Term Preterm AB TAB SAB Ectopic Multiple Living   1 1 1  0 0 0 0 0 0 1         Patient Active Problem List   Diagnosis Date Noted  . Migraine with aura   . Back pain 10/18/2013    Past Medical History  Diagnosis Date  . Asthma   . Bipolar disorder ([place])   . OCD (obsessive compulsive disorder)   . Migraine with aura     History reviewed. No pertinent past surgical history.  Current Outpatient Prescriptions  Medication Sig Dispense Refill  . carbamazepine (TEGRETOL) 200 MG tablet Take 200 mg by mouth 3 (three) times daily.  1  . clonazePAM (KLONOPIN) 1 MG tablet Take 1 tablet by mouth 4 (four) times daily.   0   No current facility-administered medications for this visit.     ALLERGIES: Latex  Family History  Problem Relation Age of Onset  . Colon cancer Maternal Grandmother   . Colon cancer Paternal Grandfather   . Prostate cancer Paternal Grandfather     also lung cancer  . Prostate cancer Father     Social History   Social History  . Marital Status: Married    Spouse Name: N/A  . Number of Children: 1   . Years of Education: N/A   Occupational History  . [employer]    Social History Main Topics  . Smoking status: Former Smoker -- 0.10 packs/day for 2 years    Quit date: 05/18/2012  . Smokeless tobacco: Never Used  . Alcohol Use: 0.0 oz/week    0 Standard drinks or equivalent per week     Comment: occ  . Drug Use: No  . Sexual Activity:    Partners: Male    Birth Control/ Protection: None   Other Topics Concern  . Not on file   Social History Narrative    Review of Systems  Constitutional: Negative.   HENT: Negative.   Eyes: Negative.   Respiratory: Negative.   Cardiovascular: Negative.   Gastrointestinal: Negative.   Genitourinary:       Heavy menstrual bleeding  Long cycles   Musculoskeletal: Negative.   Skin: Negative.   Neurological: Negative.   Endo/Heme/Allergies: Negative.   Psychiatric/Behavioral: Negative.     PHYSICAL EXAMINATION:    BP 128/70 mmHg  Pulse 84  Resp 16  Wt 297 lb (134.718 kg)  LMP 01/09/2015    General appearance: alert, cooperative and appears stated age  BBT reviewed, not cw ovulation  ASSESSMENT Infertility, anovulation on BBT charts, normal  TSH Bipolar, on tegretol and klonopin     PLAN Semen analysis She will stop the tegretol, she has already spoken to her psychiatrist and will wean off of the klonopin. She will call her psychiatrist to determine further plan. She will continue prenatal vitamins Reviewed risks of clomid Given that she isn't ovulating will check prolactin level, normal TSH level Will abstain x 2 weeks or use protection, check UPT if negative she will take provera x 5 days and start clomid on day 5 of her cycle Recommendations for timing of intercourse given F/U at the end of her next cycle   An After Visit Summary was printed and given to the patient.  25 minutes face to face time of which over 50% was spent in counseling.

## 2015-01-23 NOTE — Patient Instructions (Signed)
No unprotected intercourse x 2 weeks  Check a pregnancy test, if negative, start provera Clomiphene tablets What is this medicine? CLOMIPHENE (KLOE mi feen) is a fertility drug that increases the chance of pregnancy. It helps women ovulate (produce a mature egg) during their cycle. This medicine may be used for other purposes; ask your health care provider or pharmacist if you have questions. What should I tell my health care provider before I take this medicine? They need to know if you have any of these conditions: -adrenal gland disease -blood vessel disease or blood clots -cyst on the ovary -endometriosis -liver disease -ovarian cancer -pituitary gland disease -vaginal bleeding that has not been evaluated -an unusual or allergic reaction to clomiphene, other medicines, foods, dyes, or preservatives -pregnant (should not be used if you are already pregnant) -breast-feeding How should I use this medicine? Take this medicine by mouth with a glass of water. Follow the directions on the prescription label. Take exactly as directed for the exact number of days prescribed. Take your doses at regular intervals. Most women take this medicine for a 5 day period, but the length of treatment may be adjusted. Your doctor will give you a start date for this medication and will give you instructions on proper use. Do not take your medicine more often than directed. Talk to your pediatrician regarding the use of this medicine in children. Special care may be needed. Overdosage: If you think you have taken too much of this medicine contact a poison control center or emergency room at once. NOTE: This medicine is only for you. Do not share this medicine with others. What if I miss a dose? If you miss a dose, take it as soon as you can. If it is almost time for your next dose, take only that dose. Do not take double or extra doses. What may interact with this medicine? -herbal or dietary supplements,  like blue cohosh, black cohosh, chasteberry, or DHEA -prasterone This list may not describe all possible interactions. Give your health care provider a list of all the medicines, herbs, non-prescription drugs, or dietary supplements you use. Also tell them if you smoke, drink alcohol, or use illegal drugs. Some items may interact with your medicine. What should I watch for while using this medicine? Make sure you understand how and when to use this medicine. You need to know when you are ovulating and when to have sexual intercourse. This will increase the chance of a pregnancy. Visit your doctor or health care professional for regular checks on your progress. You may need tests to check the hormone levels in your blood or you may have to use home-urine tests to check for ovulation. Try to keep any appointments. Compared to other fertility treatments, this medicine does not greatly increase your chances of having multiple babies. An increased chance of having twins may occur in roughly 5 out of every 100 women who take this medication. Stop taking this medicine at once and contact your doctor or health care professional if you think you are pregnant. This medicine is not for long-term use. Most women that benefit from this medicine do so within the first three cycles (months). Your doctor or health care professional will monitor your condition. This medicine is usually used for a total of 6 cycles of treatment. You may get drowsy or dizzy. Do not drive, use machinery, or do anything that needs mental alertness until you know how this drug affects you. Do not stand or  sit up quickly. This reduces the risk of dizzy or fainting spells. Drinking alcoholic beverages or smoking tobacco may decrease your chance of becoming pregnant. Limit or stop alcohol and tobacco use during your fertility treatments. What side effects may I notice from receiving this medicine? Side effects that you should report to your  doctor or health care professional as soon as possible: -allergic reactions like skin rash, itching or hives, swelling of the face, lips, or tongue -breathing problems -changes in vision -fluid retention -nausea, vomiting -pelvic pain or bloating -severe abdominal pain -sudden weight gain Side effects that usually do not require medical attention (report to your doctor or health care professional if they continue or are bothersome): -breast discomfort -hot flashes -mild pelvic discomfort -mild nausea This list may not describe all possible side effects. Call your doctor for medical advice about side effects. You may report side effects to FDA at 1-800-FDA-1088. Where should I keep my medicine? Keep out of the reach of children. Store at room temperature between 15 and 30 degrees C (59 and 86 degrees F). Protect from heat, light, and moisture. Throw away any unused medicine after the expiration date. NOTE: This sheet is a summary. It may not cover all possible information. If you have questions about this medicine, talk to your doctor, pharmacist, or health care provider.    2016, Elsevier/Gold Standard. (2007-04-17 22:21:06)  Start clomid on the 5 th day of your bleeding  Start the BBT chart as soon as you finish clomid  You are most likely to ovulate 5-12 days after taking the clomid, intercourse every 24-36 hours starting 2-3 days after finishing the clomid

## 2015-01-24 LAB — PROLACTIN: Prolactin: 5.8 ng/mL

## 2015-02-13 ENCOUNTER — Telehealth: Payer: Self-pay | Admitting: Obstetrics and Gynecology

## 2015-02-13 NOTE — Telephone Encounter (Signed)
Patient notified of message from Dr. Talbert Nan. She will call back if she does not start her cycle or with any questions concerns. Routing to provider for final review. Patient agreeable to disposition. Will close encounter.

## 2015-02-13 NOTE — Telephone Encounter (Signed)
I agree, I would wait for her to start her cycle.

## 2015-02-13 NOTE — Telephone Encounter (Signed)
Spoke with patient. She states her LMP was 01/07/15. Plan from Dr. Talbert Nan was to abstain for two weeks and patient confirms she did this, then take home pregnancy test and start Provera if home pregnancy test is negative.   Patient states she expected her cycle around 02/08/15, she took a home pregnancy test and it was negative and started Provera 5 mg on on 02/08/15. She states she has had some very light spotting only and states usually her cycles are heavy. Last dose of Provera was yesterday.   Reviewed Dr. Gentry Fitz instructions with patient.  Advised since she has completed Provera she should start a cycle. When she starts her cycle, she will start take Clomid on day 5.   Routing message to Dr. Talbert Nan for review and any additional advice.

## 2015-02-13 NOTE — Telephone Encounter (Signed)
Patient calling requesting to speak with the nurse about her menstrual cycle. She said, "I have been waiting to start my cycle so I can start Clomid but I am only spotting and have been for the last few days."

## 2015-02-25 ENCOUNTER — Telehealth: Payer: Self-pay | Admitting: Obstetrics and Gynecology

## 2015-02-25 NOTE — Telephone Encounter (Signed)
Patient called and wanted to give Dr. Talbert Nan a message. She said, "I just started Clomid last month and was supposed to call in to schedule a follow up appointment but I am starting a new job soon. I won't be able to come for the next 3 months but will call after that to schedule another appointment."

## 2015-03-05 NOTE — Telephone Encounter (Signed)
Okay to close encounter or is further follow up needed at this point?

## 2015-05-06 ENCOUNTER — Telehealth: Payer: Self-pay | Admitting: Nurse Practitioner

## 2015-05-06 ENCOUNTER — Ambulatory Visit (INDEPENDENT_AMBULATORY_CARE_PROVIDER_SITE_OTHER): Payer: Self-pay | Admitting: Obstetrics & Gynecology

## 2015-05-06 ENCOUNTER — Encounter: Payer: Self-pay | Admitting: Obstetrics & Gynecology

## 2015-05-06 VITALS — BP 140/70 | HR 80 | Resp 16 | Wt 288.0 lb

## 2015-05-06 DIAGNOSIS — L680 Hirsutism: Secondary | ICD-10-CM

## 2015-05-06 DIAGNOSIS — N926 Irregular menstruation, unspecified: Secondary | ICD-10-CM

## 2015-05-06 LAB — POCT URINE PREGNANCY: Preg Test, Ur: NEGATIVE

## 2015-05-06 NOTE — Telephone Encounter (Signed)
Spoke with patient. Advised I have spoken with our practice administrator Seth Bake who states we can arrange a payment plan for the patient when she comes in for evaluation. The patient is agreeable. The patient would like to speak with her boss and return call to schedule an appointment.

## 2015-05-06 NOTE — Telephone Encounter (Signed)
Spoke with patient. Patient states that her LMP was at the end of March and ended April 4th. Two days ago she began to have light pink spotting which has now turning into bright red bleeding like her cycle. Reports she is changing her pad/tampon every 6 hours. "This is not normal for me because my cycles are usually every 32 days." Reports she is passing "flesh colored" clots and is experiencing intermittent pelvic pain with dizziness. Pain ranges from a 3/10 to a 7/10. Pain is currently a 5/10. Advised she will need to be seen in office for further evaluation. Patient reports she is in between jobs and insurance. States she does not have a PCP. Advised she will need evaluation for her bleeding and pain she is experiencing. Advised she may be seen at a local urgent care or ER. Patient prefers to be seen in our office, but has concerns about outstanding bill. Advised I will speak with the practice administrator and will return call regarding scheduling. She is agreeable.

## 2015-05-06 NOTE — Progress Notes (Signed)
GYNECOLOGY  VISIT   HPI: 25 y.o. G46P1001 Married Caucasian female with irregular cycle this month.  LMP was 04/15/15 and was "on time" for her.  She had a typical 7 day cycle with two days of worse flow.  Her cycle in march was "typical".  However, she started having pink spotting on 4/16 that increased to red, heavy bleeding yesterday with clotting.  She had a lot of cramping associated with this.  Today, the bleeding is lighter and now dark in appearance.  Typically takes Tylenol for pain.  Took once yesterday.  Cycles have been typically 32-33 days.  Generally, she does not have mid cycle spotting or irregular bleeding.  States she does not ovulate as diagnosed by BBT testing she has done over the last year.   She and her husband are interesting in trying to conceive.  Took 50mg  Clomid starting day 4 of her cycle in December or January.   She did not ovulated on this dosage.  New job promotion and so waiting for [employer].  She and her husband are going restart the process for trying to get pregnant at that time.    Denies nausea, vomiting, diarrhea, or constipation with current irregular bleeding episode.  Also, denies urinary changes.  Pt had androgen testing with normal testosterone.  Pt has PUS 12/17/14 with multiple small follicles noted and volume >42mm3, consistent with PCOS.  GYNECOLOGIC HISTORY: Patient's last menstrual period was 05/05/2015. Contraception: none  Patient Active Problem List   Diagnosis Date Noted  . Migraine with aura   . Back pain 10/18/2013    Past Medical History  Diagnosis Date  . Asthma   . Bipolar disorder ([place])   . OCD (obsessive compulsive disorder)   . Migraine with aura     History reviewed. No pertinent past surgical history.  MEDS:  Reviewed in EPIC and UTD  ALLERGIES: Latex  Family History  Problem Relation Age of Onset  . Colon cancer Maternal Grandmother   . Colon cancer Paternal Grandfather   . Prostate cancer Paternal  Grandfather     also lung cancer  . Prostate cancer Father     SH:  Married, non smoker  Review of Systems  All other systems reviewed and are negative.   PHYSICAL EXAMINATION:    BP 140/70 mmHg  Pulse 80  Resp 16  Wt 288 lb (130.636 kg)  LMP 05/05/2015    General appearance: alert, cooperative and appears stated age Abdomen: soft, non-tender; bowel sounds normal; no masses,  no organomegaly, increased hair on abdomen Pelvic: External genitalia:  no lesions              Urethra:  normal appearing urethra with no masses, tenderness or lesions              Bartholin's and Skene's: normal                 Vagina: normal appearing vagina with normal color and discharge, no lesions              Cervix: no lesions and only scant darkish discharge c/w end of bleeding              Bimanual Exam:  Uterus:  normal size, contour, position, consistency, mobility, non-tender              Adnexa: no mass, fullness, tenderness              Anus:  normal sphincter tone, no  lesions Skin:  Increased hair on back, arms, thighs, abdomen  Chaperone was present for exam.  Assessment: DUB Anovulation PCOS with physical finding of hirsutism and irregular menstrual cycles (with negative lab work but ultrasound findings c/w diagnosis)  Plan: As pt is just waiting for insurance to change to restart ovulation induction agents and bleeding is minimal today, recommend following conservatively.  Pt not interested in start any contraception even if this would help with cycles on the short term Consider fasting insulin level to see if Glucophage is appropriate when starting to conceive.  Pt will restart this discussion with Dr. Talbert Nan when she is ready to restart Clomid/Femara.

## 2015-05-06 NOTE — Telephone Encounter (Signed)
Spoke with patient. Appointment scheduled for today 05/06/2015 at 4 pm with Dr.Jertson. She is agreeable to date and time.  Routing to provider for final review. Patient agreeable to disposition. Will close encounter.

## 2015-05-06 NOTE — Telephone Encounter (Signed)
Patient is returning a call to Kaitlyn. °

## 2015-05-06 NOTE — Telephone Encounter (Signed)
Patient is having abnormal bleeding. Chart to triage. °

## 2015-12-30 ENCOUNTER — Ambulatory Visit: Payer: BLUE CROSS/BLUE SHIELD | Admitting: Nurse Practitioner

## 2016-01-16 ENCOUNTER — Ambulatory Visit: Payer: BLUE CROSS/BLUE SHIELD | Admitting: Nurse Practitioner

## 2016-01-19 ENCOUNTER — Other Ambulatory Visit: Payer: Self-pay

## 2016-02-11 LAB — OB RESULTS CONSOLE GC/CHLAMYDIA
CHLAMYDIA, DNA PROBE: NEGATIVE
GC PROBE AMP, GENITAL: NEGATIVE

## 2016-02-11 LAB — OB RESULTS CONSOLE RUBELLA ANTIBODY, IGM: RUBELLA: IMMUNE

## 2016-02-11 LAB — OB RESULTS CONSOLE ABO/RH: RH Type: POSITIVE

## 2016-02-11 LAB — OB RESULTS CONSOLE HIV ANTIBODY (ROUTINE TESTING): HIV: NONREACTIVE

## 2016-02-11 LAB — OB RESULTS CONSOLE HEPATITIS B SURFACE ANTIGEN: HEP B S AG: NEGATIVE

## 2016-02-11 LAB — OB RESULTS CONSOLE RPR: RPR: NONREACTIVE

## 2016-02-11 LAB — OB RESULTS CONSOLE ANTIBODY SCREEN: Antibody Screen: NEGATIVE

## 2016-03-08 ENCOUNTER — Encounter: Payer: No Typology Code available for payment source | Attending: Obstetrics | Admitting: *Deleted

## 2016-03-08 DIAGNOSIS — Z713 Dietary counseling and surveillance: Secondary | ICD-10-CM | POA: Insufficient documentation

## 2016-03-08 DIAGNOSIS — Z6841 Body Mass Index (BMI) 40.0 and over, adult: Secondary | ICD-10-CM | POA: Insufficient documentation

## 2016-03-08 DIAGNOSIS — Z3A11 11 weeks gestation of pregnancy: Secondary | ICD-10-CM | POA: Insufficient documentation

## 2016-03-08 DIAGNOSIS — O99810 Abnormal glucose complicating pregnancy: Secondary | ICD-10-CM | POA: Insufficient documentation

## 2016-03-09 ENCOUNTER — Encounter: Payer: Self-pay | Admitting: *Deleted

## 2016-03-09 NOTE — Progress Notes (Signed)
  Patient was seen on 03/08/16 for Gestational Diabetes self-management class at the [facility]. The following learning objectives were met by the patient during this course:   States the definition of Gestational Diabetes  States why dietary management is important in controlling blood glucose  Describes the effects each nutrient has on blood glucose levels  Demonstrates ability to create a balanced meal plan  Demonstrates carbohydrate counting   States when to check blood glucose levels  Demonstrates proper blood glucose monitoring techniques  States the effect of stress and exercise on blood glucose levels  States the importance of limiting caffeine and abstaining from alcohol and smoking  Blood glucose monitor given: yes Lot # [id number] Exp: 9/30/182 Blood glucose reading: 97  Patient instructed to monitor glucose levels: FBS: 60 - <90 1 hour: <140 2 hour: <120  *Patient received handouts:  Nutrition Diabetes and Pregnancy  Carbohydrate Counting List  Patient will be seen for follow-up as needed.

## 2016-04-19 ENCOUNTER — Other Ambulatory Visit (HOSPITAL_COMMUNITY): Payer: Self-pay | Admitting: Obstetrics

## 2016-04-19 DIAGNOSIS — O283 Abnormal ultrasonic finding on antenatal screening of mother: Secondary | ICD-10-CM

## 2016-04-20 ENCOUNTER — Encounter (HOSPITAL_COMMUNITY): Payer: Self-pay | Admitting: Emergency Medicine

## 2016-04-20 ENCOUNTER — Emergency Department (HOSPITAL_BASED_OUTPATIENT_CLINIC_OR_DEPARTMENT_OTHER)
Admit: 2016-04-20 | Discharge: 2016-04-20 | Disposition: A | Discharge status: Home / Self Care | Payer: No Typology Code available for payment source | Attending: Internal Medicine | Admitting: Internal Medicine

## 2016-04-20 ENCOUNTER — Emergency Department (HOSPITAL_COMMUNITY)
Admission: EM | Admit: 2016-04-20 | Discharge: 2016-04-20 | Disposition: A | Discharge status: Home / Self Care | Payer: No Typology Code available for payment source | Attending: Emergency Medicine | Admitting: Emergency Medicine

## 2016-04-20 DIAGNOSIS — M5441 Lumbago with sciatica, right side: Secondary | ICD-10-CM | POA: Insufficient documentation

## 2016-04-20 DIAGNOSIS — Z3A21 21 weeks gestation of pregnancy: Secondary | ICD-10-CM | POA: Insufficient documentation

## 2016-04-20 DIAGNOSIS — Z91040 Latex allergy status: Secondary | ICD-10-CM | POA: Diagnosis not present

## 2016-04-20 DIAGNOSIS — M79604 Pain in right leg: Secondary | ICD-10-CM

## 2016-04-20 DIAGNOSIS — J45909 Unspecified asthma, uncomplicated: Secondary | ICD-10-CM | POA: Diagnosis not present

## 2016-04-20 DIAGNOSIS — M79609 Pain in unspecified limb: Secondary | ICD-10-CM | POA: Diagnosis not present

## 2016-04-20 DIAGNOSIS — O9989 Other specified diseases and conditions complicating pregnancy, childbirth and the puerperium: Secondary | ICD-10-CM | POA: Diagnosis not present

## 2016-04-20 DIAGNOSIS — Z87891 Personal history of nicotine dependence: Secondary | ICD-10-CM | POA: Insufficient documentation

## 2016-04-20 DIAGNOSIS — M5431 Sciatica, right side: Secondary | ICD-10-CM

## 2016-04-20 NOTE — ED Triage Notes (Signed)
Pt sts right leg pain and sent here for possible DVT; pt currently [redacted] weeks pregnant

## 2016-04-20 NOTE — ED Triage Notes (Signed)
Per vascular tech- venous study negative

## 2016-04-20 NOTE — ED Notes (Signed)
Pt requesting referral to specialist for back

## 2016-04-20 NOTE — Progress Notes (Signed)
**  Preliminary report by tech**  Right lower extremity venous duplex complete. There is no evidence of deep or superficial vein thrombosis involving the right lower extremity. All visualized vessels appear patent and compressible. There is no evidence of a Baker's cyst on the right. Results were given to [facility] in the ED.  04/20/16 6:43 PM Carlos Levering RVT

## 2016-04-20 NOTE — Discharge Instructions (Signed)
No evidence of blood clot or infection.  Pain is likely from your back.  Please recheck with your doctor who is managing your back pain.

## 2016-04-20 NOTE — ED Provider Notes (Signed)
[facility] DEPT Provider Note   CSN: [id number] Arrival date & time: 04/20/16  1539   By signing my name below, I, Soijett Blue, attest that this documentation has been prepared under the direction and in the presence of Pattricia Boss, MD. Electronically Signed: Soijett Blue, ED Scribe. 04/20/16. 7:16 PM.  History   Chief Complaint Chief Complaint  Patient presents with  . Leg Pain    HPI Kathleen Sanders is a 26 y.o. female who presents to the Emergency Department complaining of right lower leg pain onset 2 days ago. Pt reports associated tingling to right foot. Pt has tried tylenol with no relief of her symptoms. Pt reports that she was sent to the ED to rule out a possible blood clot to her right lower leg. She states that she is currently [redacted] weeks pregnant and this is her second pregnancy. She states that she has a hx of sciatica and she goes to physical therapy and is being treated by her PB for her back pain. She denies leg swelling, numbness, weakness, and any other symptoms.     The history is provided by the patient. No language interpreter was used.    Past Medical History:  Diagnosis Date  . Asthma   . Bipolar disorder ([place])   . Migraine with aura   . OCD (obsessive compulsive disorder)     Patient Active Problem List   Diagnosis Date Noted  . Migraine with aura   . Back pain 10/18/2013    History reviewed. No pertinent surgical history.  OB History    Gravida Para Term Preterm AB Living   2 1 1  0 0 1   SAB TAB Ectopic Multiple Live Births   0 0 0 0 1       Home Medications    Prior to Admission medications   Medication Sig Start Date End Date Taking? Authorizing Provider  carbamazepine (TEGRETOL) 200 MG tablet Take 200 mg by mouth 3 (three) times daily. 11/27/14   Historical Provider, MD  clonazePAM (KLONOPIN) 1 MG tablet Take 1 tablet by mouth 4 (four) times daily.  11/27/14   Historical Provider, MD    Family History Family History  Problem  Relation Age of Onset  . Prostate cancer Father   . Colon cancer Maternal Grandmother   . Colon cancer Paternal Grandfather   . Prostate cancer Paternal Grandfather     also lung cancer    Social History Social History  Substance Use Topics  . Smoking status: Former Smoker    Packs/day: 0.10    Years: 2.00    Quit date: 05/18/2012  . Smokeless tobacco: Never Used  . Alcohol use 0.0 oz/week     Comment: occ     Allergies   Latex   Review of Systems Review of Systems  Cardiovascular: Negative for leg swelling.  Musculoskeletal: Positive for myalgias (right lower leg). Negative for arthralgias.  Neurological: Negative for weakness and numbness.       +tingling to right foot  All other systems reviewed and are negative.    Physical Exam Updated Vital Signs BP 131/70 (BP Location: Left Arm)   Pulse 96   Temp 98.6 F (37 C) (Oral)   Resp 18   Ht 5\' 6"  (1.676 m)   Wt 252 lb (114.3 kg)   LMP 11/20/2015   SpO2 98%   BMI 40.67 kg/m   Physical Exam  Constitutional: She is oriented to person, place, and time. She appears well-developed and well-nourished.  No distress.  HENT:  Head: Normocephalic and atraumatic.  Eyes: EOM are normal.  Neck: Neck supple.  Cardiovascular: Normal rate.   Pulmonary/Chest: Effort normal. No respiratory distress.  Abdominal: She exhibits no distension.  Musculoskeletal: Normal range of motion. She exhibits no edema.  Right calf is soft and non-tender. No erythema. Pulses intact.   Neurological: She is alert and oriented to person, place, and time.  DTR's equal and intact to bilateral patella.  Skin: Skin is warm and dry.  Psychiatric: She has a normal mood and affect. Her behavior is normal.  Nursing note and vitals reviewed.    ED Treatments / Results  DIAGNOSTIC STUDIES: Oxygen Saturation is 98% on RA, nl by my interpretation.    COORDINATION OF CARE: 7:13 PM Discussed treatment plan with pt at bedside which includes LE venous,  and pt agreed to plan.    Radiology No results found.  Procedures Procedures (including critical care time)  Medications Ordered in ED Medications - No data to display   Initial Impression / Assessment and Plan / ED Course  I have reviewed the triage vital signs and the nursing notes.  Pertinent imaging results that were available during my care of the patient were reviewed by me and considered in my medical decision making (see chart for details).     [redacted] week pregnant female with long h.o. Sciatica on right.  Sent for dvt evaluation and doppler negative.  Patient given referral to ortho for further back/pain management.   Final Clinical Impressions(s) / ED Diagnoses   Final diagnoses:  Right leg pain  Sciatica of right side    New Prescriptions New Prescriptions   No medications on file   I personally performed the services described in this documentation, which was scribed in my presence. The recorded information has been reviewed and considered.    Pattricia Boss, MD 04/23/16 [phone]

## 2016-04-29 ENCOUNTER — Encounter (HOSPITAL_COMMUNITY): Payer: Self-pay | Admitting: *Deleted

## 2016-04-30 ENCOUNTER — Other Ambulatory Visit (HOSPITAL_COMMUNITY): Payer: Self-pay | Admitting: *Deleted

## 2016-04-30 ENCOUNTER — Ambulatory Visit (HOSPITAL_COMMUNITY)
Admission: RE | Admit: 2016-04-30 | Discharge: 2016-04-30 | Disposition: A | Discharge status: Home / Self Care | Payer: No Typology Code available for payment source | Source: Ambulatory Visit | Attending: Obstetrics | Admitting: Obstetrics

## 2016-04-30 ENCOUNTER — Encounter (HOSPITAL_COMMUNITY): Payer: Self-pay

## 2016-04-30 ENCOUNTER — Other Ambulatory Visit (HOSPITAL_COMMUNITY): Payer: Self-pay | Admitting: Obstetrics

## 2016-04-30 DIAGNOSIS — O24419 Gestational diabetes mellitus in pregnancy, unspecified control: Secondary | ICD-10-CM

## 2016-04-30 DIAGNOSIS — E669 Obesity, unspecified: Secondary | ICD-10-CM | POA: Diagnosis not present

## 2016-04-30 DIAGNOSIS — O283 Abnormal ultrasonic finding on antenatal screening of mother: Secondary | ICD-10-CM

## 2016-04-30 DIAGNOSIS — O99212 Obesity complicating pregnancy, second trimester: Secondary | ICD-10-CM | POA: Diagnosis not present

## 2016-04-30 DIAGNOSIS — Z363 Encounter for antenatal screening for malformations: Secondary | ICD-10-CM

## 2016-04-30 DIAGNOSIS — Z880 Allergy status to penicillin: Secondary | ICD-10-CM | POA: Diagnosis not present

## 2016-04-30 DIAGNOSIS — Z91040 Latex allergy status: Secondary | ICD-10-CM | POA: Insufficient documentation

## 2016-04-30 DIAGNOSIS — Z3A22 22 weeks gestation of pregnancy: Secondary | ICD-10-CM

## 2016-04-30 DIAGNOSIS — Z6841 Body Mass Index (BMI) 40.0 and over, adult: Secondary | ICD-10-CM | POA: Insufficient documentation

## 2016-04-30 HISTORY — DX: Bipolar disorder, unspecified: F31.9

## 2016-04-30 NOTE — Consult Note (Signed)
Maternal Fetal Medicine Consultation  Requesting Provider(s): Carlis Abbott Primary OBCarlis Abbott Reason for consultation: suspected omphalocele  HPI: 26yo P1001 at 22+6 weeks with [place] from office suggestive of omphalocele. This pregnancy has been unremarkable so far except for the early diagnosis of GDM. She has had a negative NIPT evaluation. She is currently without complaints OB History: OB History    Gravida Para Term Preterm AB Living   2 1 1  0 0 1   SAB TAB Ectopic Multiple Live Births   0 0 0 0 1    NSVD at term with normal growth  PMH:  Past Medical History:  Diagnosis Date  . Asthma   . Bipolar 1 disorder ([place])   . Bipolar disorder ([place])   . Migraine with aura   . OCD (obsessive compulsive disorder)     PSH:  Past Surgical History:  Procedure Laterality Date  . NO PAST SURGERIES     Meds: See EPIC Allergies: Penicillin, latex FH: See EPIC Soc: See EPIC  Review of Systems: no vaginal bleeding or cramping/contractions, no LOF, no nausea/vomiting. All other systems reviewed and are negative.  PE: See EPIC  Please see separate document for fetal ultrasound report.  A/P: Probable solid umbilical cord mass. See [place] report for discussion. Follow up scan in 2 weeks  Thank you for the opportunity to be a part of the care of Kathleen Sanders. Please contact our office if we can be of further assistance.   I spent approximately 30 minutes with this patient with over 50% of time spent in face-to-face counseling.

## 2016-05-13 ENCOUNTER — Encounter (HOSPITAL_COMMUNITY): Payer: Self-pay

## 2016-05-13 ENCOUNTER — Ambulatory Visit (HOSPITAL_COMMUNITY)
Admission: RE | Admit: 2016-05-13 | Discharge: 2016-05-13 | Disposition: A | Discharge status: Home / Self Care | Payer: No Typology Code available for payment source | Source: Ambulatory Visit | Attending: Obstetrics | Admitting: Obstetrics

## 2016-05-13 ENCOUNTER — Other Ambulatory Visit (HOSPITAL_COMMUNITY): Payer: Self-pay | Admitting: Obstetrics and Gynecology

## 2016-05-13 DIAGNOSIS — O99212 Obesity complicating pregnancy, second trimester: Secondary | ICD-10-CM

## 2016-05-13 DIAGNOSIS — O283 Abnormal ultrasonic finding on antenatal screening of mother: Secondary | ICD-10-CM | POA: Diagnosis not present

## 2016-05-13 DIAGNOSIS — O24419 Gestational diabetes mellitus in pregnancy, unspecified control: Secondary | ICD-10-CM

## 2016-05-13 DIAGNOSIS — Z3A24 24 weeks gestation of pregnancy: Secondary | ICD-10-CM

## 2016-05-13 DIAGNOSIS — O358XX0 Maternal care for other (suspected) fetal abnormality and damage, not applicable or unspecified: Secondary | ICD-10-CM

## 2016-05-14 DIAGNOSIS — Z3A23 23 weeks gestation of pregnancy: Secondary | ICD-10-CM | POA: Insufficient documentation

## 2016-05-14 DIAGNOSIS — O358XX0 Maternal care for other (suspected) fetal abnormality and damage, not applicable or unspecified: Secondary | ICD-10-CM | POA: Insufficient documentation

## 2016-05-14 NOTE — Progress Notes (Signed)
Genetic Counseling  High-Risk Gestation Note  Appointment Date:  05/13/2016 Referred By: Kathleen Charles, MD Date of Birth:  1990-12-05 Partner:  Kathleen Sanders   Pregnancy History: [id number] Estimated Date of Delivery: 09/05/16 Estimated Gestational Age: 24w4dAttending: PBenjaman Lobe MD    I met with Mrs. Kathleen Quilterand her husband, [name] for genetic counseling because of abnormal ultrasound findings.  In summary:   Discussed ultrasound findings in detail  Small mass in umbilical cord adjacent to abdominal wall insertion  Umbilical hernia is most suspected but differential includes small omphalocele   Reviewed options for additional screening  NIPS- previously performed (Panorama) within normal limits  Echocardiogram - to be scheduled  Ongoing ultrasound- follow-up ultrasounds recommended in 2 weeks and 4 weeks  Reviewed options for diagnostic testing, including risks, benefits, limitations and alternatives- patient declined amniocentesis  Discussed option of prenatal consultation with pediatric Sanders- patient interested [facility] We began by reviewing the ultrasound in detail. Mrs. Kathleen Oncalewas seen for detailed ultrasound [facility] and mostly homogeneous mass inside the umbilical cord was visualized at that time. Follow-up ultrasound today visualized small mass in the umbilical cord next to the abdominal wall insertion site and appears stable in size since last ultrasound examination. Complete ultrasound results reported under separate cover. The top differential diagnosis is currently umbilical cord hernia, though small omphalocele or other mass cannot be completely ruled out at this time.   Omphalocele occurs in approximately in every 4000 births (M1:F5) and is defined as the protrusion of abdominal viscera through the umbilical ring, covered by membrane. This  defect is thought to arise from failure of lateral body migration and body wall closure or from the embryonic persistence of the body stalk. We discussed that in contrast, umbilical cord hernia, which is considered mild on the spectrum of abdominal wall defects, is due to a persistent defect of the abdominal wall fascia at the site of the umbilical cord with intact skin covering it and a relatively small protrusion of the bowel. In contrast to omphalocele, umbilical cord hernia is always covered by intact skin and subcutaneous tissues. Distinguishing between skin and membrane may be challenging prenatally. We discussed that even though umbilical cord hernia is the most suspected diagnosis, we discussed associations with omphalocele given that this cannot be completely ruled out at this time.   Approximately two thirds of all cases have associated anomalies, most commonly including: cardiac defects, neural tube defects, and cleft lip with or without palate. Common causes of omphaloceles include sporadic, multifactorial, and genetic etiologies. Specifically, we discussed that omphaloceles are associated with a 30-50% risk of fetal aneuploidy (trisomies 13/18), other chromosome aberrations (microdeletions, microduplications, translocations, insertions), complexes such as OEIS, and genetic syndromes including Beckwith-Wiedemann syndrome (BWS) and specific single gene conditions. We reviewed chromosomes, nondisjunction, genes, and patterns of inheritance. Mrs. JMayre Burypreviously had noninvasive prenatal screening (NIPS)/prenatal cell free DNA testing for fetal aneuploidy, which was within normal range for the conditions screened. Specifically, she had Panorama through [employer] which indicated a low risk for trisomies 21, 153 13, monosomy X, and triploidy. We reviewed the methodology of this screen and the detection rates for the conditions it assesses. She understands that it is not diagnostic for these  conditions and does not assess for all chromosome or genetic conditions. Umbilical cord hernia in contrast to omphalocele, is much less likely to be  associated with an underlying chromosome or single gene condition.    We discussed the diagnostic testing option of amniocentesis for karyotype and/or microarray analysis to test for chromosome aberrations. The risks, benefits, and limitations of amniocentesis were reviewed, including the associated 1 in [phone] risk for complications. We reviewed the limitations of prenatal screening and/or testing for single gene conditions. The couple declined amniocentesis in the pregnancy.   We discussed management and prognosis. They understand that the overall prognosis depends upon the underlying etiology; however, if isolated and nonsyndromic, the prognosis is expected to be very good, particularly in the case of umbilical cord hernia. We reviewed the option of meeting with a pediatric Sanders to review the surgical approach and postnatal management. Kathleen Sanders and elected to pursue prenatal consultation with [facility] Pediatric Surgeons. We will facilitate this appointment for the patient.   Both family histories were reviewed and found to be noncontributory for birth defects, intellectual disability, and known genetic conditions. Without further information regarding the provided family history, an accurate genetic risk cannot be calculated. Further genetic counseling is warranted if more information is obtained.  Kathleen Sanders  was provided with written information regarding cystic fibrosis (CF), spinal muscular atrophy (SMA) and hemoglobinopathies including the carrier frequency, availability of carrier screening and prenatal diagnosis if indicated.  In addition, we discussed that CF and hemoglobinopathies are routinely screened for as part of the [place] newborn screening panel.   After further discussion, she declined screening for CF, SMA and hemoglobinopathies..   Kathleen Sanders denied exposure to environmental toxins or chemical agents. She denied the use of alcohol, tobacco or street drugs. She denied significant viral illnesses during the course of her pregnancy. Her medical and surgical histories were noncontributory.   I counseled this couple regarding the above risks and available options.  The approximate face-to-face time with the genetic counselor was 20 minutes.  Chipper Oman, MS Certified Genetic Counselor 05/14/2016

## 2016-05-17 ENCOUNTER — Other Ambulatory Visit (HOSPITAL_COMMUNITY): Payer: Self-pay | Admitting: *Deleted

## 2016-05-17 DIAGNOSIS — O359XX0 Maternal care for (suspected) fetal abnormality and damage, unspecified, not applicable or unspecified: Secondary | ICD-10-CM

## 2016-05-20 ENCOUNTER — Telehealth (HOSPITAL_COMMUNITY): Payer: Self-pay | Admitting: MS"

## 2016-05-20 NOTE — Telephone Encounter (Signed)
Called Ms. Kathleen Sanders regarding prenatal pediatric surgery consultation appointment, which has been scheduled with [facility] Surgeon on 5/17 at 2:00 pm at [facility] in [place]. The patient stated that this appointment will not work for her. She has had too many doctor appointments this month and likely won't be able to miss more work. She would prefer to reschedule this consultation appointment. Stated that this should not be a problem, and that we will plan to move the prenatal peds surgery consultation later. The [facility] peds surgeons typically have prenatal consultation time allotted every 4-6 weeks. We will be back in touch with the patient once the appointment is rescheduled.   Santiago Glad Landree Fernholz 05/20/2016 10:21 AM

## 2016-05-27 ENCOUNTER — Other Ambulatory Visit (HOSPITAL_COMMUNITY): Payer: Self-pay | Admitting: Maternal and Fetal Medicine

## 2016-05-27 ENCOUNTER — Other Ambulatory Visit (HOSPITAL_COMMUNITY): Payer: Self-pay | Admitting: *Deleted

## 2016-05-27 ENCOUNTER — Ambulatory Visit (HOSPITAL_COMMUNITY)
Admission: RE | Admit: 2016-05-27 | Discharge: 2016-05-27 | Disposition: A | Discharge status: Home / Self Care | Payer: No Typology Code available for payment source | Source: Ambulatory Visit | Attending: Obstetrics | Admitting: Obstetrics

## 2016-05-27 ENCOUNTER — Encounter (HOSPITAL_COMMUNITY): Payer: Self-pay

## 2016-05-27 DIAGNOSIS — Z362 Encounter for other antenatal screening follow-up: Secondary | ICD-10-CM | POA: Diagnosis not present

## 2016-05-27 DIAGNOSIS — Z3A26 26 weeks gestation of pregnancy: Secondary | ICD-10-CM | POA: Insufficient documentation

## 2016-05-27 DIAGNOSIS — O24419 Gestational diabetes mellitus in pregnancy, unspecified control: Secondary | ICD-10-CM | POA: Insufficient documentation

## 2016-05-27 DIAGNOSIS — O99212 Obesity complicating pregnancy, second trimester: Secondary | ICD-10-CM | POA: Diagnosis not present

## 2016-05-27 DIAGNOSIS — O359XX0 Maternal care for (suspected) fetal abnormality and damage, unspecified, not applicable or unspecified: Secondary | ICD-10-CM

## 2016-05-27 DIAGNOSIS — Z3A25 25 weeks gestation of pregnancy: Secondary | ICD-10-CM

## 2016-05-27 DIAGNOSIS — O289 Unspecified abnormal findings on antenatal screening of mother: Secondary | ICD-10-CM | POA: Insufficient documentation

## 2016-05-27 DIAGNOSIS — O368990 Maternal care for other specified fetal problems, unspecified trimester, not applicable or unspecified: Secondary | ICD-10-CM

## 2016-05-28 ENCOUNTER — Other Ambulatory Visit: Payer: Self-pay

## 2016-05-28 ENCOUNTER — Encounter (HOSPITAL_COMMUNITY): Payer: Self-pay

## 2016-06-24 ENCOUNTER — Encounter (HOSPITAL_COMMUNITY): Payer: Self-pay

## 2016-06-24 ENCOUNTER — Ambulatory Visit (HOSPITAL_COMMUNITY): Payer: BLUE CROSS/BLUE SHIELD

## 2016-07-22 ENCOUNTER — Ambulatory Visit (HOSPITAL_COMMUNITY): Payer: BLUE CROSS/BLUE SHIELD

## 2016-08-03 LAB — OB RESULTS CONSOLE GBS: GBS: NEGATIVE

## 2016-08-12 ENCOUNTER — Other Ambulatory Visit: Payer: Self-pay | Admitting: Obstetrics and Gynecology

## 2016-08-17 ENCOUNTER — Encounter (HOSPITAL_COMMUNITY): Payer: Self-pay

## 2016-08-18 ENCOUNTER — Telehealth (HOSPITAL_COMMUNITY): Payer: Self-pay | Admitting: *Deleted

## 2016-08-18 NOTE — Telephone Encounter (Signed)
Preadmission screen  

## 2016-08-19 ENCOUNTER — Encounter (HOSPITAL_COMMUNITY): Payer: Self-pay

## 2016-08-20 ENCOUNTER — Encounter (HOSPITAL_COMMUNITY): Payer: Self-pay | Admitting: *Deleted

## 2016-08-20 ENCOUNTER — Inpatient Hospital Stay (HOSPITAL_COMMUNITY)
Admission: AD | Admit: 2016-08-20 | Discharge: 2016-08-20 | Disposition: A | Discharge status: Home / Self Care | Payer: No Typology Code available for payment source | Source: Ambulatory Visit | Attending: Obstetrics and Gynecology | Admitting: Obstetrics and Gynecology

## 2016-08-20 DIAGNOSIS — Z3689 Encounter for other specified antenatal screening: Secondary | ICD-10-CM

## 2016-08-20 DIAGNOSIS — Z3A37 37 weeks gestation of pregnancy: Secondary | ICD-10-CM | POA: Diagnosis not present

## 2016-08-20 DIAGNOSIS — Z87891 Personal history of nicotine dependence: Secondary | ICD-10-CM | POA: Diagnosis not present

## 2016-08-20 DIAGNOSIS — O358XX0 Maternal care for other (suspected) fetal abnormality and damage, not applicable or unspecified: Secondary | ICD-10-CM | POA: Diagnosis not present

## 2016-08-20 DIAGNOSIS — O479 False labor, unspecified: Secondary | ICD-10-CM | POA: Diagnosis not present

## 2016-08-20 DIAGNOSIS — Z880 Allergy status to penicillin: Secondary | ICD-10-CM | POA: Insufficient documentation

## 2016-08-20 DIAGNOSIS — O403XX0 Polyhydramnios, third trimester, not applicable or unspecified: Secondary | ICD-10-CM | POA: Diagnosis not present

## 2016-08-20 DIAGNOSIS — O471 False labor at or after 37 completed weeks of gestation: Secondary | ICD-10-CM | POA: Diagnosis not present

## 2016-08-20 NOTE — MAU Note (Signed)
Urine in the lab  

## 2016-08-20 NOTE — Discharge Instructions (Signed)

## 2016-08-20 NOTE — MAU Note (Signed)
Pt sent from office for prolonged monitoring for decel in office. 4cm on Tuesday.

## 2016-08-20 NOTE — MAU Provider Note (Signed)
Chief Complaint:  Non-stress Test   None     HPI: Kathleen Sanders is a 26 y.o. G2P1001 at [redacted]w[redacted]d who presents to maternity admissions sent from the office for possible deceleration on NST and BPP 6/8 with 2 off for breathing.  She reports intermittent cramping/contractions every 5-10 minutes that are painful and worsening gradually over 2-3 days.  She was 4 cm 1 week ago in the office.  There are no associated symptoms.  She has not tried any treatments.  She reports good fetal movement, denies LOF, vaginal bleeding, vaginal itching/burning, urinary symptoms, h/a, dizziness, n/v, or fever/chills.    HPI  Past Medical History: Past Medical History:  Diagnosis Date  . Anemia   . Asthma   . Bipolar 1 disorder ([place])   . Bipolar disorder ([place])   . Gestational diabetes   . Hx of chronic pyelonephritis   . Migraine with aura   . OCD (obsessive compulsive disorder)     Past obstetric history: OB History  Gravida Para Term Preterm AB Living  2 1 1  0 0 1  SAB TAB Ectopic Multiple Live Births  0 0 0 0 1    # Outcome Date GA Lbr Len/2nd Weight Sex Delivery Anes PTL Lv  2 Current           1 Term 2009 [redacted]w[redacted]d  8 lb 8 oz (3.856 kg) M Vag-Spont   LIV      Past Surgical History: Past Surgical History:  Procedure Laterality Date  . NO PAST SURGERIES      Family History: Family History  Problem Relation Age of Onset  . Prostate cancer Father   . Colon cancer Maternal Grandmother   . Colon cancer Paternal Grandfather   . Prostate cancer Paternal Grandfather        also lung cancer    Social History: Social History  Substance Use Topics  . Smoking status: Former Smoker    Packs/day: 0.10    Years: 2.00    Quit date: 05/18/2012  . Smokeless tobacco: Never Used  . Alcohol use 0.0 oz/week     Comment: occ    Allergies:  Allergies  Allergen Reactions  . Amoxicillin Nausea And Vomiting  . Penicillins Nausea And Vomiting  . Latex Rash    Meds:  Prescriptions Prior to Admission   Medication Sig Dispense Refill Last Dose  . carbamazepine (TEGRETOL) 200 MG tablet Take 200 mg by mouth 3 (three) times daily.  1 More than a month at Unknown time  . clonazePAM (KLONOPIN) 1 MG tablet Take 1 tablet by mouth 4 (four) times daily.   0 More than a month at Unknown time  . Prenatal Vit w/Fe-Methylfol-FA (PNV PO) Take by mouth.   08/19/2016 at Unknown time  . sertraline (ZOLOFT) 25 MG tablet Take 25 mg by mouth daily.   More than a month at Unknown time    ROS:  Review of Systems  Constitutional: Negative for chills, fatigue and fever.  Eyes: Negative for visual disturbance.  Respiratory: Negative for shortness of breath.   Cardiovascular: Negative for chest pain.  Gastrointestinal: Positive for abdominal pain. Negative for nausea and vomiting.  Genitourinary: Positive for pelvic pain. Negative for difficulty urinating, dysuria, flank pain, vaginal bleeding, vaginal discharge and vaginal pain.  Neurological: Negative for dizziness and headaches.  Psychiatric/Behavioral: Negative.      I have reviewed patient's Past Medical Hx, Surgical Hx, Family Hx, Social Hx, medications and allergies.   Physical Exam  Patient Vitals for the past 24 hrs:  BP Temp Temp src Pulse Resp  08/20/16 1714 119/65 98.5 F (36.9 C) Oral 77 18   Constitutional: Well-developed, well-nourished female in no acute distress.  Cardiovascular: normal rate Respiratory: normal effort GI: Abd soft, non-tender, gravid appropriate for gestational age.  MS: Extremities nontender, no edema, normal ROM Neurologic: Alert and oriented x 4.  GU: Neg CVAT.  PELVIC EXAM: Cervix pink, visually closed, without lesion, scant white creamy discharge, vaginal walls and external genitalia normal Bimanual exam: Cervix 0/long/high, firm, anterior, neg CMT, uterus nontender, nonenlarged, adnexa without tenderness, enlargement, or mass  Dilation: 4 Effacement (%): 50 Cervical Position: Posterior Station:  -3 Presentation: Vertex Exam by:: Dr Harrington Challenger  FHT:  Baseline 135 , moderate variability, accelerations present, no decelerations Contractions: q 2-10 mins, mild to palpation   Labs: No results found for this or any previous visit (from the past 24 hour(s)). A/Positive/-- (01/24 0000)  Imaging:  No results found.  MAU Course/MDM:  NST reviewed and reactive x 2 hours in [place] with presentation, exam findings and test results. Dr Harrington Challenger in MAU and performed cervical exam, no change from pt office visit earlier this week so not in active labor Labor precautions reviewed Keep appointment on Tuesday in the office  Return to MAU as needed for signs of labor or emergencies Pt stable at time of discharge.   Assessment: 1. NST (non-stress test) reactive   2. Umbilical cord hernia, fetal, affecting care of mother, antepartum   3. Polyhydramnios affecting pregnancy in third trimester   4. Threatened labor at term     Plan: Discharge home Labor precautions and fetal kick counts  Follow-up Information    Vanessa Kick, MD Follow up.   Specialty:  Obstetrics and Gynecology Why:  As scheduled, return to MAU as needed for emergencies Contact information: [address] [place] [address] [phone]          Allergies as of 08/20/2016      Reactions   Amoxicillin Nausea And Vomiting   Penicillins Nausea And Vomiting   Latex Rash      Medication List    TAKE these medications   carbamazepine 200 MG tablet Commonly known as:  TEGRETOL Take 200 mg by mouth 3 (three) times daily.   clonazePAM 1 MG tablet Commonly known as:  KLONOPIN Take 1 tablet by mouth 4 (four) times daily.   PNV PO Take by mouth.   sertraline 25 MG tablet Commonly known as:  ZOLOFT Take 25 mg by mouth daily.       [name] Certified Nurse-Midwife 08/20/2016 6:56 PM

## 2016-08-30 ENCOUNTER — Encounter (HOSPITAL_COMMUNITY)
Admission: RE | Admit: 2016-08-30 | Discharge: 2016-08-30 | Disposition: A | Discharge status: Home / Self Care | Payer: No Typology Code available for payment source | Source: Ambulatory Visit | Attending: Obstetrics and Gynecology | Admitting: Obstetrics and Gynecology

## 2016-08-30 HISTORY — DX: Gestational diabetes mellitus in pregnancy, unspecified control: O24.419

## 2016-08-30 HISTORY — DX: Personal history of urinary (tract) infections: Z87.440

## 2016-08-30 HISTORY — DX: Anemia, unspecified: D64.9

## 2016-08-30 HISTORY — DX: Personal history of other diseases of urinary system: Z87.448

## 2016-08-30 LAB — TYPE AND SCREEN
ABO/RH(D): A POS
Antibody Screen: NEGATIVE

## 2016-08-30 LAB — CBC
HEMATOCRIT: 32 % — AB (ref 36.0–46.0)
HEMOGLOBIN: 10.7 g/dL — AB (ref 12.0–15.0)
MCH: 29.8 pg (ref 26.0–34.0)
MCHC: 33.4 g/dL (ref 30.0–36.0)
MCV: 89.1 fL (ref 78.0–100.0)
Platelets: 131 10*3/uL — ABNORMAL LOW (ref 150–400)
RBC: 3.59 MIL/uL — AB (ref 3.87–5.11)
RDW: 15.2 % (ref 11.5–15.5)
WBC: 7.2 10*3/uL (ref 4.0–10.5)

## 2016-08-30 LAB — ABO/RH: ABO/RH(D): A POS

## 2016-08-30 MED ORDER — GENTAMICIN SULFATE 40 MG/ML IJ SOLN
INTRAVENOUS | Status: AC
  Administered 2016-08-31: 10:00:00 via INTRAVENOUS
  Filled 2016-08-30: qty 10

## 2016-08-30 NOTE — Pre-Procedure Instructions (Signed)
Anesthesia notifed and o orders received

## 2016-08-30 NOTE — Patient Instructions (Signed)
Morning Sun  08/30/2016   Your procedure is scheduled on:  08/31/2016  Enter through the Main Entrance of [facility] at [place] up the phone at the desk and dial [phone].   Call this number if you have problems the morning of surgery: [phone]   Remember:   Do not eat food:After Midnight.  Do not drink clear liquids: After Midnight.  Take these medicines the morning of surgery with A SIP OF WATER: none   Do not wear jewelry, make-up or nail polish.  Do not wear lotions, powders, or perfumes. Do not wear deodorant.  Do not shave 48 hours prior to surgery.  Do not bring valuables to the hospital.  [facility] is not   responsible for any belongings or valuables brought to the hospital.  Contacts, dentures or bridgework may not be worn into surgery.  Leave suitcase in the car. After surgery it may be brought to your room.  For patients admitted to the hospital, checkout time is 11:00 AM the day of              discharge.   Patients discharged the day of surgery will not be allowed to drive             home.  Name and phone number of your driver: na  Special Instructions:   N/A   Please read over the following fact sheets that you were given:   Surgical Site Infection Prevention

## 2016-08-31 ENCOUNTER — Encounter (HOSPITAL_COMMUNITY): Payer: Self-pay | Admitting: *Deleted

## 2016-08-31 ENCOUNTER — Encounter (HOSPITAL_COMMUNITY)
Admission: RE | Disposition: A | Discharge status: Home / Self Care | Payer: Self-pay | Source: Ambulatory Visit | Attending: Obstetrics and Gynecology

## 2016-08-31 ENCOUNTER — Inpatient Hospital Stay (HOSPITAL_COMMUNITY): Payer: No Typology Code available for payment source | Admitting: Anesthesiology

## 2016-08-31 ENCOUNTER — Inpatient Hospital Stay (HOSPITAL_COMMUNITY)
Admission: RE | Admit: 2016-08-31 | Discharge: 2016-09-02 | DRG: 765 | Disposition: A | Discharge status: Home / Self Care | Payer: No Typology Code available for payment source | Source: Ambulatory Visit | Attending: Obstetrics and Gynecology | Admitting: Obstetrics and Gynecology

## 2016-08-31 DIAGNOSIS — O24424 Gestational diabetes mellitus in childbirth, insulin controlled: Secondary | ICD-10-CM | POA: Diagnosis present

## 2016-08-31 DIAGNOSIS — O358XX0 Maternal care for other (suspected) fetal abnormality and damage, not applicable or unspecified: Secondary | ICD-10-CM | POA: Diagnosis present

## 2016-08-31 DIAGNOSIS — O403XX0 Polyhydramnios, third trimester, not applicable or unspecified: Secondary | ICD-10-CM | POA: Diagnosis present

## 2016-08-31 DIAGNOSIS — O3663X0 Maternal care for excessive fetal growth, third trimester, not applicable or unspecified: Secondary | ICD-10-CM | POA: Diagnosis present

## 2016-08-31 DIAGNOSIS — Z87891 Personal history of nicotine dependence: Secondary | ICD-10-CM | POA: Diagnosis not present

## 2016-08-31 DIAGNOSIS — O9902 Anemia complicating childbirth: Secondary | ICD-10-CM | POA: Diagnosis present

## 2016-08-31 DIAGNOSIS — D649 Anemia, unspecified: Secondary | ICD-10-CM | POA: Diagnosis present

## 2016-08-31 DIAGNOSIS — Z3A39 39 weeks gestation of pregnancy: Secondary | ICD-10-CM | POA: Diagnosis not present

## 2016-08-31 LAB — RPR: RPR Ser Ql: NONREACTIVE

## 2016-08-31 LAB — GLUCOSE, CAPILLARY
GLUCOSE-CAPILLARY: 88 mg/dL (ref 65–99)
Glucose-Capillary: 82 mg/dL (ref 65–99)

## 2016-08-31 SURGERY — Surgical Case
Anesthesia: Spinal

## 2016-08-31 MED ORDER — LACTATED RINGERS IV SOLN
INTRAVENOUS | Status: DC
  Administered 2016-09-01: 999 mL via INTRAVENOUS

## 2016-08-31 MED ORDER — MORPHINE SULFATE (PF) 0.5 MG/ML IJ SOLN
INTRAMUSCULAR | Status: AC
  Filled 2016-08-31: qty 10

## 2016-08-31 MED ORDER — BUPIVACAINE IN DEXTROSE 0.75-8.25 % IT SOLN
INTRATHECAL | Status: AC
  Filled 2016-08-31: qty 2

## 2016-08-31 MED ORDER — DIPHENHYDRAMINE HCL 25 MG PO CAPS
25.0000 mg | ORAL_CAPSULE | Freq: Four times a day (QID) | ORAL | Status: DC | PRN
Start: 2016-08-31 — End: 2016-09-02
  Administered 2016-08-31: 25 mg via ORAL
  Filled 2016-08-31 (×2): qty 1

## 2016-08-31 MED ORDER — FENTANYL CITRATE (PF) 100 MCG/2ML IJ SOLN
INTRAMUSCULAR | Status: DC | PRN
  Administered 2016-08-31: 20 ug via INTRATHECAL

## 2016-08-31 MED ORDER — SENNOSIDES-DOCUSATE SODIUM 8.6-50 MG PO TABS
2.0000 | ORAL_TABLET | ORAL | Status: DC
  Administered 2016-08-31 – 2016-09-01 (×2): 2 via ORAL
  Filled 2016-08-31 (×2): qty 2

## 2016-08-31 MED ORDER — MORPHINE SULFATE (PF) 0.5 MG/ML IJ SOLN
INTRAMUSCULAR | Status: DC | PRN
  Administered 2016-08-31: .2 mg via INTRATHECAL

## 2016-08-31 MED ORDER — OXYCODONE-ACETAMINOPHEN 5-325 MG PO TABS
1.0000 | ORAL_TABLET | ORAL | Status: DC | PRN
  Administered 2016-08-31 – 2016-09-01 (×4): 1 via ORAL
  Filled 2016-08-31 (×4): qty 1

## 2016-08-31 MED ORDER — PHENYLEPHRINE 8 MG IN D5W 100 ML (0.08MG/ML) PREMIX OPTIME
INJECTION | INTRAVENOUS | Status: DC | PRN
  Administered 2016-08-31: 60 ug/min via INTRAVENOUS

## 2016-08-31 MED ORDER — OXYCODONE-ACETAMINOPHEN 5-325 MG PO TABS
2.0000 | ORAL_TABLET | ORAL | Status: DC | PRN
  Administered 2016-08-31: 2 via ORAL
  Filled 2016-08-31: qty 2

## 2016-08-31 MED ORDER — DEXAMETHASONE SODIUM PHOSPHATE 4 MG/ML IJ SOLN
INTRAMUSCULAR | Status: AC
  Filled 2016-08-31: qty 1

## 2016-08-31 MED ORDER — ONDANSETRON HCL 4 MG/2ML IJ SOLN
INTRAMUSCULAR | Status: DC | PRN
  Administered 2016-08-31: 4 mg via INTRAVENOUS

## 2016-08-31 MED ORDER — ZOLPIDEM TARTRATE 5 MG PO TABS: 5.0000 mg | ORAL_TABLET | Freq: Every evening | ORAL | Status: DC | PRN

## 2016-08-31 MED ORDER — DIBUCAINE 1 % RE OINT: 1.0000 "application " | TOPICAL_OINTMENT | RECTAL | Status: DC | PRN

## 2016-08-31 MED ORDER — OXYTOCIN 10 UNIT/ML IJ SOLN
INTRAMUSCULAR | Status: AC
  Filled 2016-08-31: qty 4

## 2016-08-31 MED ORDER — OXYTOCIN 10 UNIT/ML IJ SOLN
INTRAVENOUS | Status: DC | PRN
  Administered 2016-08-31: 40 [IU] via INTRAVENOUS

## 2016-08-31 MED ORDER — ONDANSETRON HCL 4 MG/2ML IJ SOLN
INTRAMUSCULAR | Status: AC
  Filled 2016-08-31: qty 2

## 2016-08-31 MED ORDER — WITCH HAZEL-GLYCERIN EX PADS: 1.0000 "application " | MEDICATED_PAD | CUTANEOUS | Status: DC | PRN

## 2016-08-31 MED ORDER — DEXAMETHASONE SODIUM PHOSPHATE 4 MG/ML IJ SOLN
INTRAMUSCULAR | Status: DC | PRN
  Administered 2016-08-31: 4 mg via INTRAVENOUS

## 2016-08-31 MED ORDER — PRENATAL MULTIVITAMIN CH
1.0000 | ORAL_TABLET | Freq: Every day | ORAL | Status: DC
  Administered 2016-09-01: 1 via ORAL
  Filled 2016-08-31 (×2): qty 1

## 2016-08-31 MED ORDER — IBUPROFEN 600 MG PO TABS
600.0000 mg | ORAL_TABLET | Freq: Four times a day (QID) | ORAL | Status: DC
  Administered 2016-08-31 – 2016-09-02 (×8): 600 mg via ORAL
  Filled 2016-08-31 (×8): qty 1

## 2016-08-31 MED ORDER — SIMETHICONE 80 MG PO CHEW
80.0000 mg | CHEWABLE_TABLET | Freq: Three times a day (TID) | ORAL | Status: DC
  Administered 2016-08-31 – 2016-09-02 (×5): 80 mg via ORAL
  Filled 2016-08-31 (×5): qty 1

## 2016-08-31 MED ORDER — PHENYLEPHRINE 8 MG IN D5W 100 ML (0.08MG/ML) PREMIX OPTIME
INJECTION | INTRAVENOUS | Status: AC
  Filled 2016-08-31: qty 100

## 2016-08-31 MED ORDER — OXYTOCIN 40 UNITS IN LACTATED RINGERS INFUSION - SIMPLE MED: 2.5000 [IU]/h | INTRAVENOUS | Status: AC

## 2016-08-31 MED ORDER — TETANUS-DIPHTH-ACELL PERTUSSIS 5-2.5-18.5 LF-MCG/0.5 IM SUSP: 0.5000 mL | Freq: Once | INTRAMUSCULAR | Status: DC

## 2016-08-31 MED ORDER — BUPIVACAINE IN DEXTROSE 0.75-8.25 % IT SOLN
INTRATHECAL | Status: DC | PRN
  Administered 2016-08-31: 12 mL via INTRATHECAL

## 2016-08-31 MED ORDER — LACTATED RINGERS IV SOLN
INTRAVENOUS | Status: DC
  Administered 2016-08-31 (×3): via INTRAVENOUS

## 2016-08-31 MED ORDER — SIMETHICONE 80 MG PO CHEW
80.0000 mg | CHEWABLE_TABLET | ORAL | Status: DC | PRN
  Administered 2016-09-01: 80 mg via ORAL
  Filled 2016-08-31: qty 1

## 2016-08-31 MED ORDER — NITROGLYCERIN 0.4 MG/SPRAY TL SOLN
Status: AC
  Filled 2016-08-31: qty 4.9

## 2016-08-31 MED ORDER — COCONUT OIL OIL
1.0000 "application " | TOPICAL_OIL | Status: DC | PRN
  Filled 2016-08-31: qty 120

## 2016-08-31 MED ORDER — LACTATED RINGERS IV SOLN
INTRAVENOUS | Status: DC | PRN
  Administered 2016-08-31: 10:00:00 via INTRAVENOUS

## 2016-08-31 MED ORDER — METHYLERGONOVINE MALEATE 0.2 MG PO TABS: 0.2000 mg | ORAL_TABLET | ORAL | Status: DC | PRN

## 2016-08-31 MED ORDER — SIMETHICONE 80 MG PO CHEW
80.0000 mg | CHEWABLE_TABLET | ORAL | Status: DC
  Administered 2016-08-31 – 2016-09-01 (×2): 80 mg via ORAL
  Filled 2016-08-31 (×2): qty 1

## 2016-08-31 MED ORDER — MENTHOL 3 MG MT LOZG: 1.0000 | LOZENGE | OROMUCOSAL | Status: DC | PRN

## 2016-08-31 MED ORDER — FENTANYL CITRATE (PF) 100 MCG/2ML IJ SOLN
INTRAMUSCULAR | Status: AC
  Filled 2016-08-31: qty 2

## 2016-08-31 MED ORDER — ACETAMINOPHEN 325 MG PO TABS
650.0000 mg | ORAL_TABLET | ORAL | Status: DC | PRN
  Administered 2016-08-31 – 2016-09-01 (×2): 650 mg via ORAL
  Filled 2016-08-31 (×3): qty 2

## 2016-08-31 MED ORDER — METHYLERGONOVINE MALEATE 0.2 MG/ML IJ SOLN
0.2000 mg | INTRAMUSCULAR | Status: DC | PRN
  Administered 2016-09-01: 0.2 mg via INTRAMUSCULAR

## 2016-08-31 SURGICAL SUPPLY — 32 items
ADH SKN CLS APL DERMABOND .7 (GAUZE/BANDAGES/DRESSINGS) ×1
CHLORAPREP W/TINT 26ML (MISCELLANEOUS) ×2 IMPLANT
CLAMP CORD UMBIL (MISCELLANEOUS) ×1 IMPLANT
CLOTH BEACON ORANGE TIMEOUT ST (SAFETY) ×2 IMPLANT
DERMABOND ADVANCED (GAUZE/BANDAGES/DRESSINGS) ×1
DERMABOND ADVANCED .7 DNX12 (GAUZE/BANDAGES/DRESSINGS) IMPLANT
DRSG OPSITE POSTOP 4X10 (GAUZE/BANDAGES/DRESSINGS) ×2 IMPLANT
ELECT REM PT RETURN 9FT ADLT (ELECTROSURGICAL) ×2
ELECTRODE REM PT RTRN 9FT ADLT (ELECTROSURGICAL) ×1 IMPLANT
EXTRACTOR VACUUM M CUP 4 TUBE (SUCTIONS) ×1 IMPLANT
GLOVE BIO SURGEON STRL SZ7 (GLOVE) ×2 IMPLANT
GLOVE BIOGEL PI IND STRL 7.0 (GLOVE) ×1 IMPLANT
GLOVE BIOGEL PI INDICATOR 7.0 (GLOVE) ×1
GOWN STRL REUS W/TWL LRG LVL3 (GOWN DISPOSABLE) ×4 IMPLANT
KIT ABG SYR 3ML LUER SLIP (SYRINGE) IMPLANT
NDL HYPO 25X5/8 SAFETYGLIDE (NEEDLE) IMPLANT
NEEDLE HYPO 22GX1.5 SAFETY (NEEDLE) IMPLANT
NEEDLE HYPO 25X5/8 SAFETYGLIDE (NEEDLE) IMPLANT
NS IRRIG 1000ML POUR BTL (IV SOLUTION) ×2 IMPLANT
PACK C SECTION WH (CUSTOM PROCEDURE TRAY) ×2 IMPLANT
PAD OB MATERNITY 4.3X12.25 (PERSONAL CARE ITEMS) ×2 IMPLANT
PENCIL SMOKE EVAC W/HOLSTER (ELECTROSURGICAL) ×2 IMPLANT
RTRCTR C-SECT PINK 25CM LRG (MISCELLANEOUS) ×2 IMPLANT
SUT CHROMIC 1 CTX 36 (SUTURE) ×4 IMPLANT
SUT CHROMIC 2 0 CT 1 (SUTURE) ×2 IMPLANT
SUT PDS AB 0 CTX 60 (SUTURE) ×2 IMPLANT
SUT VIC AB 2-0 CT1 27 (SUTURE) ×2
SUT VIC AB 2-0 CT1 TAPERPNT 27 (SUTURE) ×1 IMPLANT
SUT VIC AB 4-0 KS 27 (SUTURE) IMPLANT
SYR 30ML LL (SYRINGE) IMPLANT
TOWEL OR 17X24 6PK STRL BLUE (TOWEL DISPOSABLE) ×2 IMPLANT
TRAY FOLEY BAG SILVER LF 14FR (SET/KITS/TRAYS/PACK) ×2 IMPLANT

## 2016-08-31 NOTE — Anesthesia Preprocedure Evaluation (Signed)
Anesthesia Evaluation  Patient identified by MRN, date of birth, ID band Patient awake    Reviewed: Allergy & Precautions, NPO status , Patient's Chart, lab work & pertinent test results  Airway Mallampati: II  TM Distance: >3 FB Neck ROM: Full    Dental no notable dental hx.    Pulmonary neg pulmonary ROS, asthma , former smoker,    Pulmonary exam normal breath sounds clear to auscultation       Cardiovascular negative cardio ROS Normal cardiovascular exam Rhythm:Regular Rate:Normal     Neuro/Psych  Headaches, PSYCHIATRIC DISORDERS Bipolar Disorder negative neurological ROS  negative psych ROS   GI/Hepatic negative GI ROS, Neg liver ROS,   Endo/Other  negative endocrine ROS  Renal/GU negative Renal ROS  negative genitourinary   Musculoskeletal negative musculoskeletal ROS (+)   Abdominal   Peds negative pediatric ROS (+)  Hematology negative hematology ROS (+) anemia ,   Anesthesia Other Findings   Reproductive/Obstetrics negative OB ROS                             Anesthesia Physical Anesthesia Plan  ASA: II  Anesthesia Plan: Spinal   Post-op Pain Management:    Induction:   PONV Risk Score and Plan: 2 and Ondansetron, Dexamethasone, Scopolamine patch - Pre-op and Treatment may vary due to age or medical condition  Airway Management Planned: Nasal Cannula  Additional Equipment:   Intra-op Plan:   Post-operative Plan:   Informed Consent:   Plan Discussed with:   Anesthesia Plan Comments: (  )        Anesthesia Quick Evaluation

## 2016-08-31 NOTE — Op Note (Signed)
Pre-Operative Diagnosis: 1) Suspected Macrosomia 2) A2 Gestational Diabetes 3) Polyhydramnios 4) Fetal Umbilical hernia vs Oomphalocele Postoperative Diagnosis: 1) Suspected Macrosomia 2) A2 Gestational Diabetes 3) Polyhydramnios 4) Fetal Umbilical hernia vs Oomphalocele  Procedure: Primary Low Transverse cesarean section Surgeon: Dr. Vanessa Kick Assistant: None Operative Findings: Vigorous female infant in cephalic presentation with bowel noted to be herniating into the umbilical cord. The umbilical cord was cut long to avoid injury the the contents. Apgars of 9 at 1 minute and 9 at 5 minutes. Infant weighed 4495 gm, 9 pound 14.6 ounces. Copious clear amniotic fluid noted with amniotomy Specimen: Placenta to pathology EBL: Total I/O In: 2800 [I.V.:2800] Out: 1962 [Urine:150; Blood:964]   Procedure:Kathleen Sanders is an 26 year old gravida 2 para 1001 at 70 weeks and 2 days estimated gestational age who presents for cesarean section. Please see the H&P for full details of the history. Following the appropriate informed consent the patient was brought to the operating room where spinal anesthesia was administered and found to be adequate. She was placed in the dorsal supine position with a leftward tilt. She was prepped and draped in the normal sterile fashion. She was appropriately identified during a preoperative time out procedure. The scalpel was then used to make a Pfannenstiel skin incision which was carried down to the underlying layers of soft tissue to the fascia. The fascia was incised in the midline and the fascial incision was extended laterally with Mayo scissors. The superior aspect of the fascial incision was grasped with Coker clamps x2, tented up and the rectus muscles dissected off sharply with the electrocautery unit area and the same procedure was repeated on the inferior aspect of the fascial incision. The rectus muscles were separated in the midline. The abdominal peritoneum was identified,  tented up, entered sharply, and the incision was extended superiorly and inferiorly with good visualization of the bladder. The Alexis retractor was then deployed. The vesicouterine peritoneum was identified, tented up, entered sharply, and the bladder flap was created digitally. Scalpel was then used to make a low transverse incision on the uterus which was extended laterally with blunt dissection. The fetal vertex was identified, delivered easily through the uterine incision followed by the body. The infant was bulb suctioned on the operative field and cried vigorously. After a one minute delay, the umbilical cord was clamped and cut, and the infant was passed to the waiting neonatal team. The Placenta was then delivered spontaneously and the uterus was cleared of all clot and debris. The uterine incision was repaired with #1 chromic in running locked fashion followed by a second imbricating layer. The Alexis retractor was removed. The abdominal peritoneum was reapproximated with 2-0 Vicryl in a running fashion, the rectus muscles was reapproximated with #1 chromic in a running fashion. The fascia was closed with a looped PDS in a running fashion. The skin was closed with 4-0 vicryl in a subcuticular fashion and Dermabond. All sponge lap and needle counts were correct x3. Patient tolerated the procedure well and recovered in stable condition following the procedure.

## 2016-08-31 NOTE — Anesthesia Postprocedure Evaluation (Signed)
Anesthesia Post Note  Patient: Kathleen Sanders  Procedure(s) Performed: Procedure(s) (LRB): CESAREAN SECTION (N/A)     Patient location during evaluation: PACU Anesthesia Type: Spinal Level of consciousness: oriented and awake and alert Pain management: pain level controlled Vital Signs Assessment: post-procedure vital signs reviewed and stable Respiratory status: spontaneous breathing, respiratory function stable and patient connected to nasal cannula oxygen Cardiovascular status: blood pressure returned to baseline and stable Postop Assessment: no headache and no backache Anesthetic complications: no    Last Vitals:  Vitals:   08/31/16 1052 08/31/16 1100  BP: 107/62 91/61  Pulse: 63 (!) 59  Resp: 11 14  Temp: 36.8 C   SpO2: 99% 100%    Last Pain:  Vitals:   08/31/16 1100  TempSrc:   PainSc: 0-No pain   Pain Goal: Patients Stated Pain Goal: 5 (08/31/16 0746)               [name]

## 2016-08-31 NOTE — H&P (Signed)
Kathleen Sanders is a 26 y.o. female presenting for a primary cesarean section  26 yo G2P1001 @ 39+2 presents for a primary cesarean section due to estimated fetal weight greater than 4500 grams in the context of gestational diabetes. Additionally, her pregnancy has been complicated by either an umbilical hernia versus an oomphalocele. In recent weeks she has had polyhydramnios. She has been followed by 2x per week NSTs and serial AFIs OB History    Gravida Para Term Preterm AB Living   2 1 1  0 0 1   SAB TAB Ectopic Multiple Live Births   0 0 0 0 1     Past Medical History:  Diagnosis Date  . Anemia   . Asthma   . Bipolar 1 disorder ([place])   . Bipolar disorder ([place])   . Gestational diabetes   . Hx of chronic pyelonephritis   . Migraine with aura   . OCD (obsessive compulsive disorder)    Past Surgical History:  Procedure Laterality Date  . NO PAST SURGERIES     Family History: family history includes Colon cancer in her maternal grandmother and paternal grandfather; Prostate cancer in her father and paternal grandfather. Social History:  reports that she quit smoking about 4 years ago. She has a 0.20 pack-year smoking history. She has never used smokeless tobacco. She reports that she drinks alcohol. She reports that she does not use drugs.     Maternal Diabetes: Yes:  Diabetes Type:  Insulin/Medication controlled Genetic Screening: Normal Maternal Ultrasounds/Referrals: Abnormal:  Findings:   Other: Fetal Ultrasounds or other Referrals:  Fetal echo Maternal Substance Abuse:  No Significant Maternal Medications:  None Significant Maternal Lab Results:  None Other Comments:  None  ROS History   Blood pressure 116/63, pulse 71, temperature 98.8 F (37.1 C), temperature source Oral, resp. rate 18, height 5\' 6"  (1.676 m), weight 111.6 kg (246 lb), last menstrual period 11/22/2015. Exam Physical Exam  Prenatal labs: ABO, Rh: --/--/A POS, A POS (08/13 1040) Antibody: NEG (08/13  1040) Rubella: Immune (01/24 0000) RPR: Non Reactive (08/13 1040)  HBsAg: Negative (01/24 0000)  HIV: Non-reactive (01/24 0000)  GBS: Negative (07/17 0000)   Assessment/Plan: 1) Admit 2) scds for dvt prophylaxis 3) Clinda/gent for surgical prophylaxis 4) Consent for primary cesarean section   Reda Citron H. 08/31/2016, 8:04 AM

## 2016-08-31 NOTE — Anesthesia Procedure Notes (Signed)
Spinal  Patient location during procedure: OR Start time: 08/31/2016 9:20 AM End time: 08/31/2016 9:43 AM Staffing Anesthesiologist: Bernestine Holsapple Preanesthetic Checklist Completed: patient identified, site marked, surgical consent, pre-op evaluation, timeout performed, IV checked, risks and benefits discussed and monitors and equipment checked Spinal Block Patient position: sitting Prep: DuraPrep Patient monitoring: heart rate, cardiac monitor, continuous pulse ox and blood pressure Approach: midline Location: L3-4 Injection technique: single-shot Needle Needle type: Sprotte  Needle gauge: 24 G Needle length: 9 cm Assessment Sensory level: T4

## 2016-08-31 NOTE — Transfer of Care (Signed)
Immediate Anesthesia Transfer of Care Note  Patient: Kathleen Sanders  Procedure(s) Performed: Procedure(s): CESAREAN SECTION (N/A)  Patient Location: PACU  Anesthesia Type:Spinal  Level of Consciousness: awake, alert , oriented and patient cooperative  Airway & Oxygen Therapy: Patient Spontanous Breathing  Post-op Assessment: Report given to RN and Post -op Vital signs reviewed and stable  Post vital signs: Reviewed and stable  Last Vitals:  Vitals:   08/31/16 0746  BP: 116/63  Pulse: 71  Resp: 18  Temp: 37.1 C    Last Pain:  Vitals:   08/31/16 0746  TempSrc: Oral  PainSc: 0-No pain      Patients Stated Pain Goal: 5 (97/02/63 7858)  Complications: No apparent anesthesia complications

## 2016-09-01 LAB — GLUCOSE, CAPILLARY: Glucose-Capillary: 102 mg/dL — ABNORMAL HIGH (ref 65–99)

## 2016-09-01 LAB — CBC
HCT: 28.5 % — ABNORMAL LOW (ref 36.0–46.0)
Hemoglobin: 9.9 g/dL — ABNORMAL LOW (ref 12.0–15.0)
MCH: 30.4 pg (ref 26.0–34.0)
MCHC: 34.7 g/dL (ref 30.0–36.0)
MCV: 87.4 fL (ref 78.0–100.0)
PLATELETS: 136 10*3/uL — AB (ref 150–400)
RBC: 3.26 MIL/uL — ABNORMAL LOW (ref 3.87–5.11)
RDW: 15.1 % (ref 11.5–15.5)
WBC: 9.3 10*3/uL (ref 4.0–10.5)

## 2016-09-01 LAB — TYPE AND SCREEN
ABO/RH(D): A POS
Antibody Screen: NEGATIVE

## 2016-09-01 MED ORDER — METHYLERGONOVINE MALEATE 0.2 MG/ML IJ SOLN
INTRAMUSCULAR | Status: AC
  Filled 2016-09-01: qty 1

## 2016-09-01 MED ORDER — METHYLERGONOVINE MALEATE 0.2 MG/ML IJ SOLN: 0.2000 mg | INTRAMUSCULAR | Status: DC | PRN

## 2016-09-01 MED ORDER — METHYLERGONOVINE MALEATE 0.2 MG PO TABS: 0.2000 mg | ORAL_TABLET | ORAL | Status: DC | PRN

## 2016-09-01 NOTE — Progress Notes (Signed)
RN called into room as patient had "passed out" while moving to sit in the chair.  Patient sitting in chair when RN entered room.  Patient moved to bed with RN assistance.  Fundus firm U/1 with minimal amount of bleeding.  Blood Pressure 113/54 with 55 bpm.  Dr. Harrington Challenger notified, to put in CBC order for tomorrow morning.

## 2016-09-01 NOTE — Plan of Care (Signed)
Problem: Life Cycle: Goal: Risk for postpartum hemorrhage will decrease Outcome: Progressing Continuing to monitor fundal height and firmness.  Fundus firm U/E at time of shift change.  At 0840 fundus boggy with moderate bleeding.  0845 Fundus firm with massage.  39g blood loss noted.  Fundus firm U/1 through the duration of the shift.   Problem: Nutritional: Goal: Dietary intake will improve Outcome: Progressing Patient tolerating regular diet Goal: Mothers verbalization of comfort with breastfeeding process will improve Outcome: Not Applicable Date Met: 68/12/75 Patient bottle feeding.  Newborn in the NICU at [facility].  Problem: Role Relationship: Goal: Ability to demonstrate positive interaction with newborn will improve Outcome: Progressing Delayed status due to infant status.  Infant transported to [facility] for surgery.  Patient taken to NICU prior to infant transfer.  Patient held infant for first time.    Problem: Pain Management: Goal: General experience of comfort will improve and pain level will decrease Outcome: Progressing Continuing to monitor pain level. Patient experiencing relief with percocet and motrin.    Problem: Bowel/Gastric: Goal: Gastrointestinal status will improve Outcome: Progressing Bowel sounds audible, patient states she is not yet passing flatus.

## 2016-09-01 NOTE — Plan of Care (Signed)
Problem: Bowel/Gastric: Goal: Will not experience complications related to bowel motility Outcome: Progressing Discussed with pt. Importance of PO hydration (primarly H20), balanced diet, and ambulation (as she can tolerate). Pt. States she has constipation. Warm prune juice provided. Will give pt. Colace at 0000. Discussed lowering use of percocet and trying tylenol first. Pt. States she understands and is in agreement with POC. Will continue to monitor.

## 2016-09-01 NOTE — Clinical Social Work Maternal (Signed)
CLINICAL SOCIAL WORK MATERNAL/CHILD NOTE  Patient Details  Name: Alazne Quant MRN: [id number] Date of Birth: 12/07/1990  Date:  09/01/2016  Clinical Social Worker Initiating Note:  Terri Piedra, [place] Date/ Time Initiated:  09/01/16/1330     Child's Name:  Lorie Phenix   Legal Guardian:  Other (Comment) (Parents: Janett Billow and Mammie Russian)   Need for Interpreter:  None   Date of Referral:   (No referral-NICU admission)     Reason for Referral:      Referral Source:      Address:  [address], [place], [place] [address]  Phone number:  [phone]   Household Members:  Spouse, Minor Children (MOB has one other child/Kaden, age 62)   Natural Supports (not living in the home):  Immediate Family (MOB's family live in [place].  FOB's father lives in [place] and mother lives in [place].  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   Education:      Pensions consultant:  Multimedia programmer   Other Resources:      Cultural/Religious Considerations Which May Impact Care: None stated.    Strengths:  Ability to meet basic needs , Pediatrician chosen , Home prepared for child    Risk Factors/Current Problems:  Mental Health Concerns  (Bipolar II, hx of PP Psychosis)   Cognitive State:  Able to Concentrate , Alert , Linear Thinking , Insightful , Goal Oriented    Mood/Affect:  Calm , Comfortable , Interested , Euthymic    CSW Assessment: CSW met with MOB in her third floor room/311 to offer support and complete assessment due to Bipolar Disorder and hx of PP Psychosis.  CSW understands baby has been diagnosed with omphalocele and transferred to [facility] earlier today for surgical consult.   MOB was awake, alone, and receptive to CSW's visit.  She was very calm and easy to engage.  She states she  and baby are doing well and that she has been aware of the possibility of omphalocele since her 20 week anatomy scan.  She reports she was referred to MFM and that this dx was later "ruled out," but found again around 35 weeks.  She states she feels she has coped well with this situation and understands it is a small omphalocele and will be corrected by surgery.  She states this pregnancy was difficult for her due to increased back pain from an old 4 wheeling injury and increase in depression due to having to be off of her regular psychotropic medication, but otherwise ok.  MOB was open about her mental health hx and states she experience postpartum psychosis after her first child was born.  She notes that she also lacked social support and was 17 at the time of his birth.  She states she was diagnosed with Bipolar II after experiencing postpartum psychosis and began receiving counseling and medication management.  She reports that, prior to pregnancy, she took Tegretol and Klonopin and that this combination works extremely well to control her symptoms.  She reports that she will have follow up at [facility] in a week and is eager to restart her medication.  She does not feel that she needs counseling at this time, as she states she has now been dealing with this dx for more than 7 years and knows  how to manage her symptoms.  She states an increase in depression during pregnancy, while off her medication, and states she took a low dose of Zoloft to get through.  She is not taking Zoloft at this time.  She states she copes with stress by having "alone time" and that "my husband knows the drill" and is extremely supportive in ensuring that she gets alone time when she identifies the need.  She states that her parents live very close by and are involved and supportive.  CSW reviewed PMADs and provided MOB with a New Mom Checklist from Postpartum Progress as a self-evaluation tool in addition to the [place] PP Depression  Screen.  CSW provided information about support groups held at [facility] was attentive and appreciative of information given by CSW.  She is hopeful that she will be released from the hospital tomorrow, but is understanding that since she had a significant drop in BP that she may not be ready for discharge tomorrow.  She states she passed out earlier today, but is currently feeling better.   CSW informed her of the option of staying at [facility] in [place] to be closer to baby, but MOB thinks she will be more comfortable recovering at home and traveling back and forth.  She understands that she can speak with a Education officer, museum at [facility] if she would like more information at any time.   MOB reports no questions, concerns or needs at this time and seemed to be appreciative of CSW's visit.    CSW Plan/Description:  Information/Referral to [employer] , No Further Intervention Required/No Barriers to Discharge, Patient/Family Education     Alphonzo Cruise, [place] 09/01/2016, 3:49 PM

## 2016-09-01 NOTE — Progress Notes (Signed)
At approx 0840 this RN was called into the patient's room to administer pain medication.  The RN entered the room to find the patient sitting on the toilet appearing pale. The patient's husband stated she had passed out and was shaking.  The Rn pulled the call bell.  The patient was escorted back to bed via a [place] and RN assist. LR IV fluid bolus was initiated, Dr Harrington Challenger was paged.  Fundal tone was boggy upon assessment.  With massage fundus was firm U/2.  Hemorrhage protocol initiated per Dr. Harrington Challenger. 0.2mg  Methergine, 1L LR administered. Blood loss measured 39 grams.

## 2016-09-02 LAB — CBC
HCT: 29.7 % — ABNORMAL LOW (ref 36.0–46.0)
Hemoglobin: 10.1 g/dL — ABNORMAL LOW (ref 12.0–15.0)
MCH: 30.5 pg (ref 26.0–34.0)
MCHC: 34 g/dL (ref 30.0–36.0)
MCV: 89.7 fL (ref 78.0–100.0)
PLATELETS: 146 10*3/uL — AB (ref 150–400)
RBC: 3.31 MIL/uL — AB (ref 3.87–5.11)
RDW: 15.1 % (ref 11.5–15.5)
WBC: 7.9 10*3/uL (ref 4.0–10.5)

## 2016-09-02 MED ORDER — IBUPROFEN 600 MG PO TABS: 600.0000 mg | ORAL_TABLET | Freq: Four times a day (QID) | ORAL | 0 refills | Status: DC

## 2016-09-02 MED ORDER — OXYCODONE HCL 5 MG PO CAPS: 5.0000 mg | ORAL_CAPSULE | Freq: Four times a day (QID) | ORAL | 0 refills | Status: DC | PRN

## 2016-09-02 NOTE — Progress Notes (Signed)
Subjective: Postpartum Day 1: Cesarean Delivery Patient reports pain controlled, no nausea or vomiting Pt had a syncopal episode on the toilet. She passed a clot and then appeared to pass out. RN assisted patient back to her bed. BP was low but stable for her. Her pulse was also low. Pad inspected, bleeding appeared appropriate. Pt now doing better   Objective: Vital signs in last 24 hours: Temp:  [97.8 F (36.6 C)-98.3 F (36.8 C)] 98.2 F (36.8 C) (08/16 0000) Pulse Rate:  [52-69] 56 (08/16 0000) Resp:  [16-18] 18 (08/16 0000) BP: (91-118)/(49-63) 116/63 (08/16 0000) SpO2:  [98 %-100 %] 98 % (08/16 0000)  Physical Exam:  General: alert, cooperative and appears stated age Lochia: appropriate Uterine Fundus: firm Incision: healing well DVT Evaluation: No evidence of DVT seen on physical exam.   Recent Labs  08/30/16 1040 09/01/16 0523  HGB 10.7* 9.9*  HCT 32.0* 28.5*    Assessment/Plan: Status post Cesarean section. Doing well postoperatively.  Continue current care.  Adelynne Joerger H. 09/02/2016, 7:52 AM

## 2016-09-02 NOTE — Discharge Summary (Signed)
Obstetric Discharge Summary Reason for Admission: cesarean section for fetal macrosomia Prenatal Procedures: NST and ultrasound Intrapartum Procedures: cesarean: low cervical, transverse Postpartum Procedures: none Complications-Operative and Postpartum: none Hemoglobin  Date Value Ref Range Status  09/02/2016 10.1 (L) 12.0 - 15.0 g/dL Final   Hemoglobin, fingerstick  Date Value Ref Range Status  12/06/2013 13.1 12.0 - 16.0 g/dL Final   HCT  Date Value Ref Range Status  09/02/2016 29.7 (L) 36.0 - 46.0 % Final    Physical Exam:  General: alert, cooperative and appears stated age 26: appropriate Uterine Fundus: firm Incision: healing well, no significant drainage, no dehiscence DVT Evaluation: No evidence of DVT seen on physical exam.  Discharge Diagnoses: Term Pregnancy-delivered  Discharge Information: Date: 09/02/2016 Activity: pelvic rest Diet: routine Medications: PNV, Ibuprofen and oxycodone Condition: stable Instructions: refer to practice specific booklet Discharge to: home Follow-up Information    Vanessa Kick, MD Follow up in 4 week(s).   Specialty:  Obstetrics and Gynecology Contact information: [place] [address] [phone]           Newborn Data: Live born female  Birth Weight: 9 lb 14.6 oz (4495 g) APGAR: 9, 9  Baby with small omphalocele--transferred to [facility] for surgical repair on 8/16  [place] 09/02/2016, 8:46 AM

## 2016-09-02 NOTE — Discharge Instructions (Signed)
You may wash incision with soap and water.   °Do not soak the incision for 2 weeks (no tub baths or swimming).   °Keep incision dry. You may need to keep a sanitary pad or panty liner between the incision and your clothing for comfort and to keep the incision dry.  If you note drainage, increased pain, or increased redness of the incision, then please notify your physician. ° °Pelvic rest x 6 weeks (no intercourse or tampons)  ° °No lifting over 10 lbs for 6 weeks.  ° °Do not drive until you are not taking narcotic pain medication AND you can comfortably slam on the brakes. ° ° ° °

## 2016-09-02 NOTE — Progress Notes (Signed)
Patient ID: Kathleen Sanders, female   DOB: Feb 06, 1990, 26 y.o.   MRN: [id number]  POD#1  Pt ambulating well. No further syncopal episodes. Tolerating po. Pain controlled. Pt c/o being itchy AFVSS A/P 1) Routine PO care 2) Baby transferred to [facility] in [place]. Pt would like to be discharged in the am

## 2016-09-02 NOTE — Progress Notes (Signed)
This is a late entry note from a visit that took place 8/15.  I spent time with Erlene Quan, FOB, who was very, very anxious about leaving his wife after her significant events.  He experienced some trauma when she passed out and continues to see images of her from that moment.  He is also anxious about his son, Evette Doffing, and wants to be present with him for the surgery.  I offered calming support and helped him think through his next steps, including that he needed to call someone to drive him over to be with his son, as he was feeling too anxious to be driving.  I talked with him about the nature of trauma and how if he continues to see images of his wife passing out even after she is home and well, that he may need some additional support to work through that trauma.    Pt was doing fine and was very calm.  Her mother was coming to be with her and her father and son were here to see her and Evette Doffing.  Chaplain Katy Everlie Eble, Bcc Pager, [phone] 10:10 AM    09/02/16 1000  Clinical Encounter Type  Visited With Family;Patient and family together  Visit Type Spiritual support  Referral From Nurse  Spiritual Encounters  Spiritual Needs Emotional  Stress Factors  Patient Stress Factors Exhausted

## 2016-09-02 NOTE — Progress Notes (Signed)
Patient is doing well.  She is ambulating, voiding, tolerating PO.  Pain control is good.  Lochia is appropriate Vagal episode yesterday.  Ambulated in hall and to bathroom overnight without difficulty.  Repeat cbc stable at 10.1 Baby at [facility] today for small omphalocele  Vitals:   09/01/16 1210 09/01/16 1615 09/01/16 2036 09/02/16 0000  BP: (!) 100/49 (!) 118/59 (!) 106/58 116/63  Pulse: (!) 56 69 (!) 55 (!) 56  Resp: 16 16 18 18   Temp: 98.2 F (36.8 C) 98.1 F (36.7 C) 98.3 F (36.8 C) 98.2 F (36.8 C)  TempSrc: Oral Oral Oral Oral  SpO2: 98% 99% 99% 98%  Weight:      Height:        NAD Abd: soft, appropriately TTP.  Non-distended.  Fundus firm.  Incision c/d/i  Ext: trace b/l  Lab Results  Component Value Date   WBC 7.9 09/02/2016   HGB 10.1 (L) 09/02/2016   HCT 29.7 (L) 09/02/2016   MCV 89.7 09/02/2016   PLT 146 (L) 09/02/2016    --/--/A POS (08/15 0531)/Rimmune  A/P 26 y.o. [id number] POD#2 s/p primary c/s for macrosomia. Routine care.   Meeting all goals Expect d/c today.    [place]

## 2016-09-02 NOTE — Progress Notes (Signed)
Post discharge chart review completed.  

## 2017-12-05 DIAGNOSIS — J019 Acute sinusitis, unspecified: Secondary | ICD-10-CM | POA: Diagnosis not present

## 2018-01-04 DIAGNOSIS — R05 Cough: Secondary | ICD-10-CM | POA: Diagnosis not present

## 2018-01-04 DIAGNOSIS — J069 Acute upper respiratory infection, unspecified: Secondary | ICD-10-CM | POA: Diagnosis not present

## 2018-01-09 DIAGNOSIS — J069 Acute upper respiratory infection, unspecified: Secondary | ICD-10-CM | POA: Diagnosis not present

## 2018-03-13 DIAGNOSIS — N390 Urinary tract infection, site not specified: Secondary | ICD-10-CM | POA: Diagnosis not present

## 2018-03-13 DIAGNOSIS — R309 Painful micturition, unspecified: Secondary | ICD-10-CM | POA: Diagnosis not present

## 2018-03-13 DIAGNOSIS — R110 Nausea: Secondary | ICD-10-CM | POA: Diagnosis not present

## 2018-07-06 ENCOUNTER — Other Ambulatory Visit: Payer: Self-pay

## 2018-07-06 ENCOUNTER — Emergency Department (HOSPITAL_BASED_OUTPATIENT_CLINIC_OR_DEPARTMENT_OTHER)
Admission: EM | Admit: 2018-07-06 | Discharge: 2018-07-06 | Disposition: A | Discharge status: Home / Self Care | Payer: 59 | Attending: Emergency Medicine | Admitting: Emergency Medicine

## 2018-07-06 ENCOUNTER — Emergency Department (HOSPITAL_BASED_OUTPATIENT_CLINIC_OR_DEPARTMENT_OTHER): Payer: 59

## 2018-07-06 ENCOUNTER — Encounter (HOSPITAL_BASED_OUTPATIENT_CLINIC_OR_DEPARTMENT_OTHER): Payer: Self-pay

## 2018-07-06 DIAGNOSIS — Z87891 Personal history of nicotine dependence: Secondary | ICD-10-CM | POA: Diagnosis not present

## 2018-07-06 DIAGNOSIS — R109 Unspecified abdominal pain: Secondary | ICD-10-CM | POA: Insufficient documentation

## 2018-07-06 DIAGNOSIS — Y939 Activity, unspecified: Secondary | ICD-10-CM | POA: Insufficient documentation

## 2018-07-06 DIAGNOSIS — Y999 Unspecified external cause status: Secondary | ICD-10-CM | POA: Insufficient documentation

## 2018-07-06 DIAGNOSIS — J45909 Unspecified asthma, uncomplicated: Secondary | ICD-10-CM | POA: Diagnosis not present

## 2018-07-06 DIAGNOSIS — S20221A Contusion of right back wall of thorax, initial encounter: Secondary | ICD-10-CM | POA: Insufficient documentation

## 2018-07-06 DIAGNOSIS — X58XXXA Exposure to other specified factors, initial encounter: Secondary | ICD-10-CM | POA: Diagnosis not present

## 2018-07-06 DIAGNOSIS — Z79899 Other long term (current) drug therapy: Secondary | ICD-10-CM | POA: Insufficient documentation

## 2018-07-06 DIAGNOSIS — T148XXA Other injury of unspecified body region, initial encounter: Secondary | ICD-10-CM

## 2018-07-06 DIAGNOSIS — Y929 Unspecified place or not applicable: Secondary | ICD-10-CM | POA: Insufficient documentation

## 2018-07-06 HISTORY — DX: Polycystic ovarian syndrome: E28.2

## 2018-07-06 LAB — COMPREHENSIVE METABOLIC PANEL
ALT: 15 U/L (ref 0–44)
AST: 16 U/L (ref 15–41)
Albumin: 4.1 g/dL (ref 3.5–5.0)
Alkaline Phosphatase: 59 U/L (ref 38–126)
Anion gap: 9 (ref 5–15)
BUN: 16 mg/dL (ref 6–20)
CO2: 24 mmol/L (ref 22–32)
Calcium: 8.8 mg/dL — ABNORMAL LOW (ref 8.9–10.3)
Chloride: 106 mmol/L (ref 98–111)
Creatinine, Ser: 0.81 mg/dL (ref 0.44–1.00)
GFR calc Af Amer: >60 mL/min (ref >60)
GFR calc non Af Amer: >60 mL/min (ref >60)
Glucose, Bld: 103 mg/dL — ABNORMAL HIGH (ref 70–99)
Potassium: 3.7 mmol/L (ref 3.5–5.1)
Sodium: 139 mmol/L (ref 135–145)
Total Bilirubin: 0.1 mg/dL — ABNORMAL LOW (ref 0.3–1.2)
Total Protein: 7 g/dL (ref 6.5–8.1)

## 2018-07-06 LAB — URINALYSIS, ROUTINE W REFLEX MICROSCOPIC
Bilirubin Urine: NEGATIVE
Glucose, UA: NEGATIVE mg/dL
Hgb urine dipstick: NEGATIVE
Ketones, ur: NEGATIVE mg/dL
Leukocytes,Ua: NEGATIVE
Nitrite: NEGATIVE
Protein, ur: NEGATIVE mg/dL
Specific Gravity, Urine: 1.02 (ref 1.005–1.030)
pH: 7 (ref 5.0–8.0)

## 2018-07-06 LAB — CBC WITH DIFFERENTIAL/PLATELET
Abs Immature Granulocytes: 0.01 10*3/uL (ref 0.00–0.07)
Basophils Absolute: 0 10*3/uL (ref 0.0–0.1)
Basophils Relative: 1 %
Eosinophils Absolute: 0.2 10*3/uL (ref 0.0–0.5)
Eosinophils Relative: 2 %
HCT: 36.9 % (ref 36.0–46.0)
Hemoglobin: 12.6 g/dL (ref 12.0–15.0)
Immature Granulocytes: 0 %
Lymphocytes Relative: 27 %
Lymphs Abs: 1.8 10*3/uL (ref 0.7–4.0)
MCH: 32.4 pg (ref 26.0–34.0)
MCHC: 34.1 g/dL (ref 30.0–36.0)
MCV: 94.9 fL (ref 80.0–100.0)
Monocytes Absolute: 0.5 10*3/uL (ref 0.1–1.0)
Monocytes Relative: 8 %
Neutro Abs: 4.2 10*3/uL (ref 1.7–7.7)
Neutrophils Relative %: 62 %
Platelets: 192 10*3/uL (ref 150–400)
RBC: 3.89 MIL/uL (ref 3.87–5.11)
RDW: 12.3 % (ref 11.5–15.5)
WBC: 6.7 10*3/uL (ref 4.0–10.5)
nRBC: 0 % (ref 0.0–0.2)

## 2018-07-06 LAB — PROTIME-INR
INR: 1 (ref 0.8–1.2)
Prothrombin Time: 12.9 seconds (ref 11.4–15.2)

## 2018-07-06 LAB — PREGNANCY, URINE: Preg Test, Ur: NEGATIVE

## 2018-07-06 LAB — LIPASE, BLOOD: Lipase: 36 U/L (ref 11–51)

## 2018-07-06 MED ORDER — IOHEXOL 300 MG/ML  SOLN
100.0000 mL | Freq: Once | INTRAMUSCULAR | Status: AC | PRN
  Administered 2018-07-06: 100 mL via INTRAVENOUS

## 2018-07-06 NOTE — ED Provider Notes (Signed)
Patient handed off to me at 3 PM from Dr. Ayesha Rumpf.  Patient with right-sided flank bruising about 2 days ago.  Has had some pain and itching sensation in this area.  Not sure of any obvious trauma.  Patient denies any fever.  She does occasionally take ibuprofen and Tylenol.  Patient thus far with unremarkable work-up.  Platelets normal.  Coags normal.  Urinalysis unremarkable.  Pregnancy test negative.  Awaiting CT scan to rule out intra-abdominal bleed.  Patient with CT scan that showed no acute findings.  The spleen is mildly enlarged but otherwise no abnormalities.  There is no signs of bleeding.  Patient was made aware of enlarged spleen.  Denies any other symptoms including fatigue or other symptoms consistent with mono.  Does not have any tenderness in the spleen area.  Recommend follow-up with primary care doctor and told her return to the ED if symptoms worsen.  This chart was dictated using voice recognition software.  Despite best efforts to proofread,  errors can occur which can change the documentation meaning.     Lennice Sites, DO 07/06/18 1739

## 2018-07-06 NOTE — ED Triage Notes (Signed)
Pt reports she had area to right flank that was itchy while at work ~8pm Tueday -then noticed bruising to area ~11pm that day-pt denies injury- seen today and sent from [facility]

## 2018-07-06 NOTE — ED Provider Notes (Signed)
[facility] EMERGENCY DEPARTMENT Provider Note   CSN: [id number] Arrival date & time: 07/06/18  1312     History   Chief Complaint Chief Complaint  Patient presents with  . Bleeding/Bruising    HPI Kathleen Sanders is a 28 y.o. female.     The history is provided by the patient. No language interpreter was used.   Kathleen Sanders is a 28 y.o. female who presents to the Emergency Department complaining of bruising. He presents to the emergency department for evaluation of right flank bruising that began two days ago. She was at work when she noticed the sudden onset of a stinging and itching sensation that was intense in nature. It was located along her right flank. She went home from work and when her husband looked at her back he noticed bruising. She denies any trauma to the area. She did scratch the area but not significantly. She denies any fevers, shortness of breath, nausea, vomiting, dysuria, numbness, weakness. She does have some mild abdominal discomfort for the last week. She does take occasional over-the-counter ibuprofen and acetaminophen. She drinks occasional alcohol, none recently. No prior similar symptoms. She was evaluated in urgent care and referred to the ED for further evaluation. Past Medical History:  Diagnosis Date  . Anemia   . Asthma   . Bipolar 1 disorder ([place])   . Bipolar disorder ([place])   . Gestational diabetes   . Hx of chronic pyelonephritis   . Migraine with aura   . OCD (obsessive compulsive disorder)   . PCOS (polycystic ovarian syndrome)     Patient Active Problem List   Diagnosis Date Noted  . Cesarean delivery, delivered, current hospitalization 08/31/2016  . Umbilical cord hernia, fetal, affecting care of mother, antepartum 05/14/2016  . [redacted] weeks gestation of pregnancy   . Migraine with aura   . Back pain 10/18/2013    Past Surgical History:  Procedure Laterality Date  . CESAREAN SECTION N/A 08/31/2016   Procedure: CESAREAN  SECTION;  Surgeon: Vanessa Kick, MD;  Location: [facility];  Service: Obstetrics;  Laterality: N/A;  . NO PAST SURGERIES       OB History    Gravida  2   Para  2   Term  2   Preterm  0   AB  0   Living  2     SAB  0   TAB  0   Ectopic  0   Multiple  0   Live Births  2            Home Medications    Prior to Admission medications   Medication Sig Start Date End Date Taking? Authorizing Provider  acetaminophen (TYLENOL) 325 MG tablet Take 325-650 mg by mouth every 6 (six) hours as needed (for pain/headache.).    [provider]  ibuprofen (ADVIL,MOTRIN) 600 MG tablet Take 1 tablet (600 mg total) by mouth every 6 (six) hours. 09/02/16   Jerelyn Charles, MD  oxycodone (OXY-IR) 5 MG capsule Take 1 capsule (5 mg total) by mouth every 6 (six) hours as needed. 09/02/16   Jerelyn Charles, MD  Prenatal Vit w/Fe-Methylfol-FA (PNV PO) Take 1 tablet by mouth daily.     [provider]    Family History Family History  Problem Relation Age of Onset  . Prostate cancer Father   . Colon cancer Maternal Grandmother   . Colon cancer Paternal Grandfather   . Prostate cancer Paternal Grandfather  also lung cancer    Social History Social History   Tobacco Use  . Smoking status: Former Smoker    Packs/day: 0.10    Years: 2.00    Pack years: 0.20    Quit date: 05/18/2012    Years since quitting: 6.1  . Smokeless tobacco: Never Used  Substance Use Topics  . Alcohol use: Yes    Alcohol/week: 0.0 standard drinks    Comment: occ  . Drug use: No     Allergies   Amoxicillin, Penicillins, and Latex   Review of Systems Review of Systems  All other systems reviewed and are negative.    Physical Exam Updated Vital Signs BP 136/73 (BP Location: Right Arm)   Pulse 79   Temp 98.3 F (36.8 C) (Oral)   Resp 14   Ht 5\' 6"  (1.676 m)   Wt 117.9 kg   SpO2 100%   BMI 41.97 kg/m   Physical Exam Vitals signs and nursing note reviewed.   Constitutional:      Appearance: She is well-developed.  HENT:     Head: Normocephalic and atraumatic.  Cardiovascular:     Rate and Rhythm: Normal rate and regular rhythm.  Pulmonary:     Effort: Pulmonary effort is normal. No respiratory distress.  Abdominal:     Palpations: Abdomen is soft.     Tenderness: There is no guarding or rebound.     Comments: Mild to moderate right hemi abdominal tenderness without guarding or rebound. There is ecchymosis over the right flank in various degrees of healing. There is tenderness to palpation throughout the right flank.  Musculoskeletal:        General: No swelling or tenderness.     Comments: 2+ DP pulses bilaterally  Skin:    General: Skin is warm and dry.  Neurological:     Mental Status: She is alert and oriented to person, place, and time.  Psychiatric:        Behavior: Behavior normal.      ED Treatments / Results  Labs (all labs ordered are listed, but only abnormal results are displayed) Labs Reviewed - No data to display  EKG    Radiology No results found.  Procedures Procedures (including critical care time)  Medications Ordered in ED Medications - No data to display   Initial Impression / Assessment and Plan / ED Course  I have reviewed the triage vital signs and the nursing notes.  Pertinent labs & imaging results that were available during my care of the patient were reviewed by me and considered in my medical decision making (see chart for details).        Patient here for evaluation of ecchymosis to her right flank, no history of trauma. CBC is reassuring. Plan to obtain CT abdomen pelvis to evaluate for possible retroperitoneal hematoma. Patient care transferred pending CMP, CT abdomen pelvis. Final Clinical Impressions(s) / ED Diagnoses   Final diagnoses:  None    ED Discharge Orders    None       Quintella Reichert, MD 07/06/18 1507

## 2018-08-14 ENCOUNTER — Other Ambulatory Visit: Payer: Self-pay

## 2018-08-14 ENCOUNTER — Encounter: Payer: Self-pay | Admitting: Emergency Medicine

## 2018-08-14 ENCOUNTER — Ambulatory Visit
Admission: EM | Admit: 2018-08-14 | Discharge: 2018-08-14 | Disposition: A | Discharge status: Home / Self Care | Payer: 59 | Attending: Family Medicine | Admitting: Family Medicine

## 2018-08-14 DIAGNOSIS — R51 Headache: Secondary | ICD-10-CM | POA: Diagnosis not present

## 2018-08-14 DIAGNOSIS — R509 Fever, unspecified: Secondary | ICD-10-CM | POA: Diagnosis not present

## 2018-08-14 DIAGNOSIS — Z20822 Contact with and (suspected) exposure to covid-19: Secondary | ICD-10-CM

## 2018-08-14 DIAGNOSIS — R05 Cough: Secondary | ICD-10-CM | POA: Diagnosis not present

## 2018-08-14 DIAGNOSIS — Z20828 Contact with and (suspected) exposure to other viral communicable diseases: Secondary | ICD-10-CM

## 2018-08-14 DIAGNOSIS — R0602 Shortness of breath: Secondary | ICD-10-CM

## 2018-08-14 NOTE — ED Triage Notes (Addendum)
Pt presents to [facility] for assessment after approx 1 week ago her knee "bent the wrong way" when a large german shepherd jumped onto her.  Patient c/o pain with ambulation and weight-bearing, denies pain sitting.    Pt also c/o sinus congestion, post-nasal drip, sore throat, and pressure behind her eyes x 2-3 days.    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

## 2018-08-14 NOTE — ED Provider Notes (Signed)
[name]    CSN: [id number] Arrival date & time: 08/14/18  1828     History   Chief Complaint Chief Complaint  Patient presents with   Knee Pain   Nasal Congestion    HPI Kathleen Sanders is a 28 y.o. female.   HPI  Patient is here for 2 conditions.  First she has an upper respiratory infection, some sinus drainage, and headache for 2 or 3 days.  She is had a low-grade temperature at home to 100 degrees.  She has 2 children.  Both of them been sick.  Have asthma shortness of breath coughing.  Neither of them were taken for COVID testing.  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  She does wear a mask.  Her children are watched by grandparents during the day.  The younger one does go to daycare. She also has pain in her right knee.  She states that her sisters dementia program to greet her and knocked her over.  She has been having pain in the lateral knee ever since then.  Past Medical History:  Diagnosis Date   Anemia    Asthma    Bipolar 1 disorder ([place])    Bipolar disorder ([place])    Gestational diabetes    Hx of chronic pyelonephritis    Migraine with aura    OCD (obsessive compulsive disorder)    PCOS (polycystic ovarian syndrome)     Patient Active Problem List   Diagnosis Date Noted   Cesarean delivery, delivered, current hospitalization 76/54/6503   Umbilical cord hernia, fetal, affecting care of mother, antepartum 05/14/2016   [redacted] weeks gestation of pregnancy    Migraine with aura    Back pain 10/18/2013    Past Surgical History:  Procedure Laterality Date   CESAREAN SECTION N/A 08/31/2016   Procedure: CESAREAN SECTION;  Surgeon: Vanessa Kick, MD;  Location: [facility];  Service: Obstetrics;  Laterality: N/A;   NO PAST SURGERIES      OB History    Gravida  2   Para  2   Term  2   Preterm  0   AB  0   Living  2     SAB    0   TAB  0   Ectopic  0   Multiple  0   Live Births  2            Home Medications    Prior to Admission medications   Medication Sig Start Date End Date Taking? Authorizing Provider  carbamazepine (CARBATROL) 300 MG 12 hr capsule Take 350 mg by mouth 2 (two) times daily. 05/01/18  Yes [provider]  clonazePAM (KLONOPIN) 1 MG tablet 1 mg 4 (four) times daily as needed. 05/22/18  Yes [provider]  ibuprofen (ADVIL,MOTRIN) 600 MG tablet Take 1 tablet (600 mg total) by mouth every 6 (six) hours. 09/02/16  Yes Jerelyn Charles, MD  venlafaxine XR (EFFEXOR-XR) 75 MG 24 hr capsule Take 75 mg by mouth 2 (two) times daily. 05/01/18  Yes [provider]  acetaminophen (TYLENOL) 325 MG tablet Take 325-650 mg by mouth every 6 (six) hours as needed (for pain/headache.).    [provider]    Family History Family History  Problem Relation Age of Onset   Prostate cancer Father    Colon cancer Maternal Grandmother    Colon cancer Paternal  Grandfather    Prostate cancer Paternal Grandfather        also lung cancer    Social History Social History   Tobacco Use   Smoking status: Former Smoker    Packs/day: 0.10    Years: 2.00    Pack years: 0.20    Quit date: 05/18/2012    Years since quitting: 6.2   Smokeless tobacco: Never Used  Substance Use Topics   Alcohol use: Yes    Alcohol/week: 0.0 standard drinks    Comment: occ   Drug use: No     Allergies   Amoxicillin, Penicillins, and Latex   Review of Systems Review of Systems  Constitutional: Positive for fatigue and fever. Negative for chills.  HENT: Positive for congestion, postnasal drip, sinus pressure and sinus pain. Negative for ear pain and sore throat.   Eyes: Negative for pain and visual disturbance.  Respiratory: Negative for cough and shortness of breath.   Cardiovascular: Negative for chest pain and palpitations.  Gastrointestinal: Negative for abdominal pain and  vomiting.  Genitourinary: Negative for dysuria and hematuria.  Musculoskeletal: Positive for gait problem and joint swelling. Negative for arthralgias and back pain.  Skin: Negative for color change and rash.  Neurological: Positive for headaches. Negative for seizures and syncope.  All other systems reviewed and are negative.    Physical Exam   Updated Vital Signs BP 116/70 (BP Location: Left Arm)    Pulse 93    Temp (!) 101.2 F (38.4 C) (Oral)    Resp 18    SpO2 96%      Physical Exam Constitutional:      General: She is not in acute distress.    Appearance: She is well-developed. She is obese.     Comments: Antalgic gait  HENT:     Head: Normocephalic and atraumatic.     Right Ear: Tympanic membrane and ear canal normal.     Left Ear: Tympanic membrane and ear canal normal.     Nose: Nose normal. No congestion.     Mouth/Throat:     Mouth: Mucous membranes are moist.     Pharynx: Posterior oropharyngeal erythema present.  Eyes:     Conjunctiva/sclera: Conjunctivae normal.     Pupils: Pupils are equal, round, and reactive to light.  Neck:     Musculoskeletal: Normal range of motion.  Cardiovascular:     Rate and Rhythm: Normal rate and regular rhythm.     Heart sounds: Normal heart sounds.  Pulmonary:     Effort: Pulmonary effort is normal. No respiratory distress.     Breath sounds: Wheezing present.     Comments: Few scattered wheeze Abdominal:     General: There is no distension.     Palpations: Abdomen is soft.  Musculoskeletal: Normal range of motion.     Comments: Right knee has no effusion.  Trace warmth.  Lateral joint line tenderness.  No instability  Lymphadenopathy:     Cervical: No cervical adenopathy.  Skin:    General: Skin is warm and dry.  Neurological:     General: No focal deficit present.     Mental Status: She is alert.  Psychiatric:        Mood and Affect: Mood normal.        Behavior: Behavior normal.      UC Treatments / Results    Labs (all labs ordered are listed, but only abnormal results are displayed) Labs Reviewed  NOVEL CORONAVIRUS, NAA  EKG   Radiology No results found.  Procedures Procedures (including critical care time)  Medications Ordered in UC Medications - No data to display  Initial Impression / Assessment and Plan / UC Course  I have reviewed the triage vital signs and the nursing notes.  Pertinent labs & imaging results that were available during my care of the patient were reviewed by me and considered in my medical decision making (see chart for details).      Final Clinical Impressions(s) / UC Diagnoses   Final diagnoses:  Suspected 2019 Novel Coronavirus Infection     Discharge Instructions     Tylenol only for pain and fever May use OTC cold medicine Read the COVID home instructions, and quarantine is recommended for you :AND your family until test is available     Person Under Monitoring Name: Suriyah Vergara  Location: [address] [place] [address]   Infection Prevention Recommendations for Individuals Confirmed to have, or Being Evaluated for, 2019 Novel Coronavirus (COVID-19) Infection Who Receive Care at Home  Individuals who are confirmed to have, or are being evaluated for, COVID-19 should follow the prevention steps below until a healthcare provider or local or state health department says they can return to normal activities.  Stay home except to get medical care You should restrict activities outside your home, except for getting medical care. Do not go to work, school, or public areas, and do not use public transportation or taxis.  Call ahead before visiting your doctor Before your medical appointment, call the healthcare provider and tell them that you have, or are being evaluated for, COVID-19 infection. This will help the healthcare providers office take steps to keep other people from getting infected. Ask your healthcare provider to  call the local or state health department.  Monitor your symptoms Seek prompt medical attention if your illness is worsening (e.g., difficulty breathing). Before going to your medical appointment, call the healthcare provider and tell them that you have, or are being evaluated for, COVID-19 infection. Ask your healthcare provider to call the local or state health department.  Wear a facemask You should wear a facemask that covers your nose and mouth when you are in the same room with other people and when you visit a healthcare provider. People who live with or visit you should also wear a facemask while they are in the same room with you.  Separate yourself from other people in your home As much as possible, you should stay in a different room from other people in your home. Also, you should use a separate bathroom, if available.  Avoid sharing household items You should not share dishes, drinking glasses, cups, eating utensils, towels, bedding, or other items with other people in your home. After using these items, you should wash them thoroughly with soap and water.  Cover your coughs and sneezes Cover your mouth and nose with a tissue when you cough or sneeze, or you can cough or sneeze into your sleeve. Throw used tissues in a lined trash can, and immediately wash your hands with soap and water for at least 20 seconds or use an alcohol-based hand rub.  Wash your [facility] your hands often and thoroughly with soap and water for at least 20 seconds. You can use an alcohol-based hand sanitizer if soap and water are not available and if your hands are not visibly dirty. Avoid touching your eyes, nose, and mouth with unwashed hands.   Prevention Steps for Caregivers  and Household Members of Individuals Confirmed to have, or Being Evaluated for, COVID-19 Infection Being Cared for in the Home  If you live with, or provide care at home for, a person confirmed to have, or being  evaluated for, COVID-19 infection please follow these guidelines to prevent infection:  Follow healthcare providers instructions Make sure that you understand and can help the patient follow any healthcare provider instructions for all care.  Provide for the patients basic needs You should help the patient with basic needs in the home and provide support for getting groceries, prescriptions, and other personal needs.  Monitor the patients symptoms If they are getting sicker, call his or her medical provider and tell them that the patient has, or is being evaluated for, COVID-19 infection. This will help the healthcare providers office take steps to keep other people from getting infected. Ask the healthcare provider to call the local or state health department.  Limit the number of people who have contact with the patient  If possible, have only one caregiver for the patient.  Other household members should stay in another home or place of residence. If this is not possible, they should stay  in another room, or be separated from the patient as much as possible. Use a separate bathroom, if available.  Restrict visitors who do not have an essential need to be in the home.  Keep older adults, very young children, and other sick people away from the patient Keep older adults, very young children, and those who have compromised immune systems or chronic health conditions away from the patient. This includes people with chronic heart, lung, or kidney conditions, diabetes, and cancer.  Ensure good ventilation Make sure that shared spaces in the home have good air flow, such as from an air conditioner or an opened window, weather permitting.  Wash your hands often  Wash your hands often and thoroughly with soap and water for at least 20 seconds. You can use an alcohol based hand sanitizer if soap and water are not available and if your hands are not visibly dirty.  Avoid touching  your eyes, nose, and mouth with unwashed hands.  Use disposable paper towels to dry your hands. If not available, use dedicated cloth towels and replace them when they become wet.  Wear a facemask and gloves  Wear a disposable facemask at all times in the room and gloves when you touch or have contact with the patients blood, body fluids, and/or secretions or excretions, such as sweat, saliva, sputum, nasal mucus, vomit, urine, or feces.  Ensure the mask fits over your nose and mouth tightly, and do not touch it during use.  Throw out disposable facemasks and gloves after using them. Do not reuse.  Wash your hands immediately after removing your facemask and gloves.  If your personal clothing becomes contaminated, carefully remove clothing and launder. Wash your hands after handling contaminated clothing.  Place all used disposable facemasks, gloves, and other waste in a lined container before disposing them with other household waste.  Remove gloves and wash your hands immediately after handling these items.  Do not share dishes, glasses, or other household items with the patient  Avoid sharing household items. You should not share dishes, drinking glasses, cups, eating utensils, towels, bedding, or other items with a patient who is confirmed to have, or being evaluated for, COVID-19 infection.  After the person uses these items, you should wash them thoroughly with soap and water.  [employer]  thoroughly  Immediately remove and wash clothes or bedding that have blood, body fluids, and/or secretions or excretions, such as sweat, saliva, sputum, nasal mucus, vomit, urine, or feces, on them.  Wear gloves when handling laundry from the patient.  Read and follow directions on labels of laundry or clothing items and detergent. In general, wash and dry with the warmest temperatures recommended on the label.  Clean all areas the individual has used often  Clean all touchable surfaces,  such as counters, tabletops, doorknobs, bathroom fixtures, toilets, phones, keyboards, tablets, and bedside tables, every day. Also, clean any surfaces that may have blood, body fluids, and/or secretions or excretions on them.  Wear gloves when cleaning surfaces the patient has come in contact with.  Use a diluted bleach solution (e.g., dilute bleach with 1 part bleach and 10 parts water) or a household disinfectant with a label that says EPA-registered for coronaviruses. To make a bleach solution at home, add 1 tablespoon of bleach to 1 quart (4 cups) of water. For a larger supply, add  cup of bleach to 1 gallon (16 cups) of water.  Read labels of cleaning products and follow recommendations provided on product labels. Labels contain instructions for safe and effective use of the cleaning product including precautions you should take when applying the product, such as wearing gloves or eye protection and making sure you have good ventilation during use of the product.  Remove gloves and wash hands immediately after cleaning.  Monitor yourself for signs and symptoms of illness Caregivers and household members are considered close contacts, should monitor their health, and will be asked to limit movement outside of the home to the extent possible. Follow the monitoring steps for close contacts listed on the symptom monitoring form.   ? If you have additional questions, contact your local health department or call the epidemiologist on call at [phone] (available 24/7). ? This guidance is subject to change. For the most up-to-date guidance from [facility], please refer to their website: YouBlogs.pl       ED Prescriptions    None     Controlled Substance Prescriptions [place] Controlled Substance Registry consulted? Not Applicable   Raylene Everts, MD 08/14/18 [phone]

## 2018-08-14 NOTE — Discharge Instructions (Signed)
Tylenol only for pain and fever May use OTC cold medicine Read the COVID home instructions, and quarantine is recommended for you :AND your family until test is available     Person Under Monitoring Name: Kathleen Sanders  Location: [address] [place] [address]   Infection Prevention Recommendations for Individuals Confirmed to have, or Being Evaluated for, 2019 Novel Coronavirus (COVID-19) Infection Who Receive Care at Home  Individuals who are confirmed to have, or are being evaluated for, COVID-19 should follow the prevention steps below until a healthcare provider or local or state health department says they can return to normal activities.  Stay home except to get medical care You should restrict activities outside your home, except for getting medical care. Do not go to work, school, or public areas, and do not use public transportation or taxis.  Call ahead before visiting your doctor Before your medical appointment, call the healthcare provider and tell them that you have, or are being evaluated for, COVID-19 infection. This will help the healthcare providers office take steps to keep other people from getting infected. Ask your healthcare provider to call the local or state health department.  Monitor your symptoms Seek prompt medical attention if your illness is worsening (e.g., difficulty breathing). Before going to your medical appointment, call the healthcare provider and tell them that you have, or are being evaluated for, COVID-19 infection. Ask your healthcare provider to call the local or state health department.  Wear a facemask You should wear a facemask that covers your nose and mouth when you are in the same room with other people and when you visit a healthcare provider. People who live with or visit you should also wear a facemask while they are in the same room with you.  Separate yourself from other people in your home As much as possible, you  should stay in a different room from other people in your home. Also, you should use a separate bathroom, if available.  Avoid sharing household items You should not share dishes, drinking glasses, cups, eating utensils, towels, bedding, or other items with other people in your home. After using these items, you should wash them thoroughly with soap and water.  Cover your coughs and sneezes Cover your mouth and nose with a tissue when you cough or sneeze, or you can cough or sneeze into your sleeve. Throw used tissues in a lined trash can, and immediately wash your hands with soap and water for at least 20 seconds or use an alcohol-based hand rub.  Wash your [facility] your hands often and thoroughly with soap and water for at least 20 seconds. You can use an alcohol-based hand sanitizer if soap and water are not available and if your hands are not visibly dirty. Avoid touching your eyes, nose, and mouth with unwashed hands.   Prevention Steps for Caregivers and Household Members of Individuals Confirmed to have, or Being Evaluated for, COVID-19 Infection Being Cared for in the Home  If you live with, or provide care at home for, a person confirmed to have, or being evaluated for, COVID-19 infection please follow these guidelines to prevent infection:  Follow healthcare providers instructions Make sure that you understand and can help the patient follow any healthcare provider instructions for all care.  Provide for the patients basic needs You should help the patient with basic needs in the home and provide support for getting groceries, prescriptions, and other personal needs.  Monitor the patients symptoms If they  are getting sicker, call his or her medical provider and tell them that the patient has, or is being evaluated for, COVID-19 infection. This will help the healthcare providers office take steps to keep other people from getting infected. Ask the healthcare provider  to call the local or state health department.  Limit the number of people who have contact with the patient If possible, have only one caregiver for the patient. Other household members should stay in another home or place of residence. If this is not possible, they should stay in another room, or be separated from the patient as much as possible. Use a separate bathroom, if available. Restrict visitors who do not have an essential need to be in the home.  Keep older adults, very young children, and other sick people away from the patient Keep older adults, very young children, and those who have compromised immune systems or chronic health conditions away from the patient. This includes people with chronic heart, lung, or kidney conditions, diabetes, and cancer.  Ensure good ventilation Make sure that shared spaces in the home have good air flow, such as from an air conditioner or an opened window, weather permitting.  Wash your hands often Wash your hands often and thoroughly with soap and water for at least 20 seconds. You can use an alcohol based hand sanitizer if soap and water are not available and if your hands are not visibly dirty. Avoid touching your eyes, nose, and mouth with unwashed hands. Use disposable paper towels to dry your hands. If not available, use dedicated cloth towels and replace them when they become wet.  Wear a facemask and gloves Wear a disposable facemask at all times in the room and gloves when you touch or have contact with the patients blood, body fluids, and/or secretions or excretions, such as sweat, saliva, sputum, nasal mucus, vomit, urine, or feces.  Ensure the mask fits over your nose and mouth tightly, and do not touch it during use. Throw out disposable facemasks and gloves after using them. Do not reuse. Wash your hands immediately after removing your facemask and gloves. If your personal clothing becomes contaminated, carefully remove clothing and  launder. Wash your hands after handling contaminated clothing. Place all used disposable facemasks, gloves, and other waste in a lined container before disposing them with other household waste. Remove gloves and wash your hands immediately after handling these items.  Do not share dishes, glasses, or other household items with the patient Avoid sharing household items. You should not share dishes, drinking glasses, cups, eating utensils, towels, bedding, or other items with a patient who is confirmed to have, or being evaluated for, COVID-19 infection. After the person uses these items, you should wash them thoroughly with soap and water.  Wash laundry thoroughly Immediately remove and wash clothes or bedding that have blood, body fluids, and/or secretions or excretions, such as sweat, saliva, sputum, nasal mucus, vomit, urine, or feces, on them. Wear gloves when handling laundry from the patient. Read and follow directions on labels of laundry or clothing items and detergent. In general, wash and dry with the warmest temperatures recommended on the label.  Clean all areas the individual has used often Clean all touchable surfaces, such as counters, tabletops, doorknobs, bathroom fixtures, toilets, phones, keyboards, tablets, and bedside tables, every day. Also, clean any surfaces that may have blood, body fluids, and/or secretions or excretions on them. Wear gloves when cleaning surfaces the patient has come in contact with. Use a  diluted bleach solution (e.g., dilute bleach with 1 part bleach and 10 parts water) or a household disinfectant with a label that says EPA-registered for coronaviruses. To make a bleach solution at home, add 1 tablespoon of bleach to 1 quart (4 cups) of water. For a larger supply, add  cup of bleach to 1 gallon (16 cups) of water. Read labels of cleaning products and follow recommendations provided on product labels. Labels contain instructions for safe and effective  use of the cleaning product including precautions you should take when applying the product, such as wearing gloves or eye protection and making sure you have good ventilation during use of the product. Remove gloves and wash hands immediately after cleaning.  Monitor yourself for signs and symptoms of illness Caregivers and household members are considered close contacts, should monitor their health, and will be asked to limit movement outside of the home to the extent possible. Follow the monitoring steps for close contacts listed on the symptom monitoring form.   ? If you have additional questions, contact your local health department or call the epidemiologist on call at [phone] (available 24/7). ? This guidance is subject to change. For the most up-to-date guidance from [facility], please refer to their website: [email or web address]

## 2018-08-14 NOTE — ED Notes (Signed)
Patient able to ambulate independently  

## 2018-08-17 ENCOUNTER — Telehealth: Payer: Self-pay | Admitting: Emergency Medicine

## 2018-08-17 ENCOUNTER — Encounter (HOSPITAL_COMMUNITY): Payer: Self-pay

## 2018-08-17 LAB — NOVEL CORONAVIRUS, NAA: SARS-CoV-2, NAA: NOT DETECTED

## 2018-08-17 MED ORDER — DOXYCYCLINE HYCLATE 100 MG PO CAPS: 100.0000 mg | ORAL_CAPSULE | Freq: Two times a day (BID) | ORAL | 0 refills | Status: AC

## 2018-08-17 NOTE — Telephone Encounter (Signed)
Patient called saying still having persistent sinus symptoms x 1 week, not having improvement with flonase and zyrtec D. Sending doxycycline twice daily for 1 week.

## 2019-07-04 ENCOUNTER — Ambulatory Visit
Admission: EM | Admit: 2019-07-04 | Discharge: 2019-07-04 | Disposition: A | Discharge status: Home / Self Care | Payer: Self-pay

## 2019-07-04 DIAGNOSIS — G5603 Carpal tunnel syndrome, bilateral upper limbs: Secondary | ICD-10-CM

## 2019-07-04 MED ORDER — PREDNISONE 50 MG PO TABS: 50.0000 mg | ORAL_TABLET | Freq: Every day | ORAL | 0 refills | Status: DC

## 2019-07-04 NOTE — ED Provider Notes (Signed)
[name]    CSN: [id number] Arrival date & time: 07/04/19  1347      History   Chief Complaint Chief Complaint  Patient presents with  . Numbness    HPI Kathleen Sanders is a 29 y.o. female.   29 year old female comes in for bilateral numbness/tingling to the hand/fingers for the past 3 years, R>L. Denies injury/trauma. Numbness/tingling mostly to the 1st-3rd fingers. Occasional numbness to the whole hand. Work requires repetitive motion. Occasionally has wrist pain. Aspirin/ibuporfen without relief.     Past Medical History:  Diagnosis Date  . Anemia   . Asthma   . Bipolar 1 disorder ([place])   . Bipolar disorder ([place])   . Gestational diabetes   . Hx of chronic pyelonephritis   . Migraine with aura   . OCD (obsessive compulsive disorder)   . PCOS (polycystic ovarian syndrome)     Patient Active Problem List   Diagnosis Date Noted  . Cesarean delivery, delivered, current hospitalization 08/31/2016  . Umbilical cord hernia, fetal, affecting care of mother, antepartum 05/14/2016  . [redacted] weeks gestation of pregnancy   . Migraine with aura   . Back pain 10/18/2013    Past Surgical History:  Procedure Laterality Date  . CESAREAN SECTION N/A 08/31/2016   Procedure: CESAREAN SECTION;  Surgeon: Vanessa Kick, MD;  Location: [facility];  Service: Obstetrics;  Laterality: N/A;  . NO PAST SURGERIES      OB History    Gravida  2   Para  2   Term  2   Preterm  0   AB  0   Living  2     SAB  0   TAB  0   Ectopic  0   Multiple  0   Live Births  2            Home Medications    Prior to Admission medications   Medication Sig Start Date End Date Taking? Authorizing Provider  lamoTRIgine (LAMICTAL) 100 MG tablet Take 100 mg by mouth daily.   Yes [provider]  zolpidem (AMBIEN) 10 MG tablet Take 10 mg by mouth at bedtime.   Yes [provider]  acetaminophen (TYLENOL) 325 MG tablet Take 325-650 mg by mouth every  6 (six) hours as needed (for pain/headache.).    [provider]  carbamazepine (CARBATROL) 300 MG 12 hr capsule Take 350 mg by mouth 2 (two) times daily. 05/01/18   [provider]  clonazePAM (KLONOPIN) 1 MG tablet 1 mg 4 (four) times daily as needed. 05/22/18   [provider]  ibuprofen (ADVIL,MOTRIN) 600 MG tablet Take 1 tablet (600 mg total) by mouth every 6 (six) hours. 09/02/16   Jerelyn Charles, MD  predniSONE (DELTASONE) 50 MG tablet Take 1 tablet (50 mg total) by mouth daily with breakfast. 07/04/19   Tasia Catchings, Arvel Oquinn V, PA-C  venlafaxine XR (EFFEXOR-XR) 75 MG 24 hr capsule Take 75 mg by mouth 2 (two) times daily. 05/01/18   [provider]    Family History Family History  Problem Relation Age of Onset  . Prostate cancer Father   . Colon cancer Maternal Grandmother   . Colon cancer Paternal Grandfather   . Prostate cancer Paternal Grandfather        also lung cancer    Social History Social History   Tobacco Use  . Smoking status: Former Smoker    Packs/day: 0.10    Years: 2.00  Pack years: 0.20    Quit date: 05/18/2012    Years since quitting: 7.1  . Smokeless tobacco: Never Used  Vaping Use  . Vaping Use: Never used  Substance Use Topics  . Alcohol use: Yes    Alcohol/week: 4.0 - 5.0 standard drinks    Types: 4 - 5 Cans of beer per week    Comment: daily  . Drug use: No     Allergies   Amoxicillin, Penicillins, and Latex   Review of Systems Review of Systems  Reason unable to perform ROS: See HPI as above.     Physical Exam Triage Vital Signs ED Triage Vitals  Enc Vitals Group     BP 07/04/19 1446 109/71     Pulse Rate 07/04/19 1446 76     Resp 07/04/19 1446 20     Temp 07/04/19 1446 98.3 F (36.8 C)     Temp Source 07/04/19 1446 Oral     SpO2 07/04/19 1446 97 %     Weight --      Height --      Head Circumference --      Peak Flow --      Pain Score 07/04/19 1457 5     Pain Loc --      Pain Edu? --      Excl. in [place]?  --    No data found.  Updated Vital Signs BP 109/71 (BP Location: Right Arm)   Pulse 76   Temp 98.3 F (36.8 C) (Oral)   Resp 20   SpO2 97%   Breastfeeding No   Visual Acuity Right Eye Distance:   Left Eye Distance:   Bilateral Distance:    Right Eye Near:   Left Eye Near:    Bilateral Near:     Physical Exam Constitutional:      General: She is not in acute distress.    Appearance: Normal appearance. She is well-developed. She is not toxic-appearing or diaphoretic.  HENT:     Head: Normocephalic and atraumatic.  Eyes:     Conjunctiva/sclera: Conjunctivae normal.     Pupils: Pupils are equal, round, and reactive to light.  Pulmonary:     Effort: Pulmonary effort is normal. No respiratory distress.     Comments: Speaking in full sentences without difficulty Musculoskeletal:     Cervical back: Normal range of motion and neck supple.     Comments: No swelling, erythema, warmth, contusion. Diffuse tenderness to palpation of the right forearm, wrist, fingers. Full ROM of BUE. Strength 5/5. Sensation decreased 1st-3rd right hand. Radial pulse 2+, cap refill <2s.  Positive Tinel's, Phalen's.  Skin:    General: Skin is warm and dry.  Neurological:     Mental Status: She is alert and oriented to person, place, and time.      UC Treatments / Results  Labs (all labs ordered are listed, but only abnormal results are displayed) Labs Reviewed - No data to display  EKG   Radiology No results found.  Procedures Procedures (including critical care time)  Medications Ordered in UC Medications - No data to display  Initial Impression / Assessment and Plan / UC Course  I have reviewed the triage vital signs and the nursing notes.  Pertinent labs & imaging results that were available during my care of the patient were reviewed by me and considered in my medical decision making (see chart for details).    Patient first came in for multiple chronic complaints.  Discussed limitations in use for urgent care.  Will need follow-up with PCP for further evaluation and management needed for chronic complaints.  She is afebrile, nontoxic without acute distress. Will treat for possible carpal tunnel/tendinitis today with prednisone.  Patient already with wrist splint at home, to wear it during activity/sleep.  Ice compress, rest.  Return precautions given.  Otherwise resources provided for PCP establishment.  Patient to follow-up for work-up of chronic complaints.  Final Clinical Impressions(s) / UC Diagnoses   Final diagnoses:  Bilateral carpal tunnel syndrome    ED Prescriptions    Medication Sig Dispense Auth. Provider   predniSONE (DELTASONE) 50 MG tablet Take 1 tablet (50 mg total) by mouth daily with breakfast. 5 tablet Ok Edwards, PA-C     PDMP not reviewed this encounter.   Ok Edwards, PA-C 07/04/19 1552

## 2019-07-04 NOTE — ED Triage Notes (Signed)
Pt c/o numbness and tingling to both hands and fingers for over 3 yrs. States rt hand is having numbness pain more often in the past 3 days. Pt also states having RUQ pain with upper abdomen swelling for over 50yrs.

## 2019-07-04 NOTE — Discharge Instructions (Signed)
Start prednisone as directed. Ice compress, wrist splint during activity. Otherwise, follow up with PCP for further evaluation of chronic symptoms.

## 2020-05-21 ENCOUNTER — Emergency Department (HOSPITAL_BASED_OUTPATIENT_CLINIC_OR_DEPARTMENT_OTHER): Payer: Self-pay

## 2020-05-21 ENCOUNTER — Encounter (HOSPITAL_BASED_OUTPATIENT_CLINIC_OR_DEPARTMENT_OTHER): Payer: Self-pay | Admitting: Emergency Medicine

## 2020-05-21 ENCOUNTER — Other Ambulatory Visit: Payer: Self-pay

## 2020-05-21 ENCOUNTER — Emergency Department (HOSPITAL_BASED_OUTPATIENT_CLINIC_OR_DEPARTMENT_OTHER)
Admission: EM | Admit: 2020-05-21 | Discharge: 2020-05-21 | Disposition: A | Discharge status: Home / Self Care | Payer: Self-pay | Attending: Emergency Medicine | Admitting: Emergency Medicine

## 2020-05-21 DIAGNOSIS — R072 Precordial pain: Secondary | ICD-10-CM | POA: Insufficient documentation

## 2020-05-21 DIAGNOSIS — M549 Dorsalgia, unspecified: Secondary | ICD-10-CM | POA: Insufficient documentation

## 2020-05-21 DIAGNOSIS — R0602 Shortness of breath: Secondary | ICD-10-CM | POA: Insufficient documentation

## 2020-05-21 DIAGNOSIS — Z91040 Latex allergy status: Secondary | ICD-10-CM | POA: Insufficient documentation

## 2020-05-21 DIAGNOSIS — J45909 Unspecified asthma, uncomplicated: Secondary | ICD-10-CM | POA: Insufficient documentation

## 2020-05-21 DIAGNOSIS — R079 Chest pain, unspecified: Secondary | ICD-10-CM

## 2020-05-21 DIAGNOSIS — R197 Diarrhea, unspecified: Secondary | ICD-10-CM | POA: Insufficient documentation

## 2020-05-21 DIAGNOSIS — R109 Unspecified abdominal pain: Secondary | ICD-10-CM | POA: Insufficient documentation

## 2020-05-21 DIAGNOSIS — Z87891 Personal history of nicotine dependence: Secondary | ICD-10-CM | POA: Insufficient documentation

## 2020-05-21 LAB — CBC WITH DIFFERENTIAL/PLATELET
Abs Immature Granulocytes: 0.02 10*3/uL (ref 0.00–0.07)
Basophils Absolute: 0.1 10*3/uL (ref 0.0–0.1)
Basophils Relative: 1 %
Eosinophils Absolute: 0.3 10*3/uL (ref 0.0–0.5)
Eosinophils Relative: 4 %
HCT: 37 % (ref 36.0–46.0)
Hemoglobin: 12.6 g/dL (ref 12.0–15.0)
Immature Granulocytes: 0 %
Lymphocytes Relative: 32 %
Lymphs Abs: 1.9 10*3/uL (ref 0.7–4.0)
MCH: 31.3 pg (ref 26.0–34.0)
MCHC: 34.1 g/dL (ref 30.0–36.0)
MCV: 91.8 fL (ref 80.0–100.0)
Monocytes Absolute: 0.4 10*3/uL (ref 0.1–1.0)
Monocytes Relative: 7 %
Neutro Abs: 3.4 10*3/uL (ref 1.7–7.7)
Neutrophils Relative %: 56 %
Platelets: 243 10*3/uL (ref 150–400)
RBC: 4.03 MIL/uL (ref 3.87–5.11)
RDW: 12.9 % (ref 11.5–15.5)
WBC: 6.1 10*3/uL (ref 4.0–10.5)
nRBC: 0 % (ref 0.0–0.2)

## 2020-05-21 LAB — URINALYSIS, ROUTINE W REFLEX MICROSCOPIC
Bilirubin Urine: NEGATIVE
Glucose, UA: NEGATIVE mg/dL
Ketones, ur: NEGATIVE mg/dL
Leukocytes,Ua: NEGATIVE
Nitrite: NEGATIVE
Protein, ur: NEGATIVE mg/dL
Specific Gravity, Urine: 1.01 (ref 1.005–1.030)
pH: 6 (ref 5.0–8.0)

## 2020-05-21 LAB — URINALYSIS, MICROSCOPIC (REFLEX)

## 2020-05-21 LAB — COMPREHENSIVE METABOLIC PANEL
ALT: 19 U/L (ref 0–44)
AST: 17 U/L (ref 15–41)
Albumin: 3.8 g/dL (ref 3.5–5.0)
Alkaline Phosphatase: 59 U/L (ref 38–126)
Anion gap: 11 (ref 5–15)
BUN: 8 mg/dL (ref 6–20)
CO2: 24 mmol/L (ref 22–32)
Calcium: 8.8 mg/dL — ABNORMAL LOW (ref 8.9–10.3)
Chloride: 104 mmol/L (ref 98–111)
Creatinine, Ser: 0.98 mg/dL (ref 0.44–1.00)
GFR, Estimated: >60 mL/min (ref >60)
Glucose, Bld: 128 mg/dL — ABNORMAL HIGH (ref 70–99)
Potassium: 3.6 mmol/L (ref 3.5–5.1)
Sodium: 139 mmol/L (ref 135–145)
Total Bilirubin: 0.5 mg/dL (ref 0.3–1.2)
Total Protein: 6.2 g/dL — ABNORMAL LOW (ref 6.5–8.1)

## 2020-05-21 LAB — TROPONIN I (HIGH SENSITIVITY): Troponin I (High Sensitivity): 2 ng/L (ref <18)

## 2020-05-21 LAB — PREGNANCY, URINE: Preg Test, Ur: NEGATIVE

## 2020-05-21 LAB — LIPASE, BLOOD: Lipase: 28 U/L (ref 11–51)

## 2020-05-21 MED ORDER — SODIUM CHLORIDE 0.9 % IV BOLUS
1000.0000 mL | Freq: Once | INTRAVENOUS | Status: AC
  Administered 2020-05-21: 1000 mL via INTRAVENOUS

## 2020-05-21 NOTE — ED Provider Notes (Signed)
[facility] EMERGENCY DEPT Provider Note   CSN: [id number] Arrival date & time: 05/21/20  6283     History Chief Complaint  Patient presents with  . Chest Pain  . Flank Pain    Kathleen Sanders is a 30 y.o. female.  She is here with multiple complaints.  She is complaining of dull and sometimes sharp chest pain for 2-1/2 weeks.  It woke her up at night and she tried ibuprofen and ranitidine with some improvement.  She also has left side abdominal pain and left flank pain that is been going on for 2 years.  She had some imaging that showed an enlarged spleen at that time.  She also says she is not urinating as much.  She also states she has been on her period for a month, has PCOS.  Denies fevers chills cough.  Sometimes has shortness of breath.  No nausea or vomiting or constipation.  She sometimes has diarrhea.  She does not have a primary care doctor.  The history is provided by the patient.  Chest Pain Pain location:  Substernal area Pain quality: dull   Pain radiates to:  Does not radiate Pain severity:  Mild Onset quality:  Gradual Timing:  Intermittent Progression:  Unchanged Chronicity:  New Context: at rest   Relieved by:  None tried Worsened by:  Nothing Ineffective treatments:  None tried Associated symptoms: back pain and shortness of breath   Associated symptoms: no abdominal pain, no cough, no diaphoresis, no fever, no headache, no lower extremity edema, no nausea and no vomiting   Flank Pain This is a chronic problem. Episode onset: 2 years. The problem occurs every several days. The problem has not changed since onset.Associated symptoms include chest pain and shortness of breath. Pertinent negatives include no abdominal pain and no headaches. Nothing aggravates the symptoms. Nothing relieves the symptoms. She has tried nothing for the symptoms. The treatment provided no relief.       Past Medical History:  Diagnosis Date  . Anemia   . Asthma   .  Bipolar 1 disorder ([place])   . Bipolar disorder ([place])   . Gestational diabetes   . Hx of chronic pyelonephritis   . Migraine with aura   . OCD (obsessive compulsive disorder)   . PCOS (polycystic ovarian syndrome)     Patient Active Problem List   Diagnosis Date Noted  . Cesarean delivery, delivered, current hospitalization 08/31/2016  . Umbilical cord hernia, fetal, affecting care of mother, antepartum 05/14/2016  . [redacted] weeks gestation of pregnancy   . Migraine with aura   . Back pain 10/18/2013    Past Surgical History:  Procedure Laterality Date  . CESAREAN SECTION N/A 08/31/2016   Procedure: CESAREAN SECTION;  Surgeon: Vanessa Kick, MD;  Location: [facility];  Service: Obstetrics;  Laterality: N/A;  . NO PAST SURGERIES       OB History    Gravida  2   Para  2   Term  2   Preterm  0   AB  0   Living  2     SAB  0   IAB  0   Ectopic  0   Multiple  0   Live Births  2           Family History  Problem Relation Age of Onset  . Prostate cancer Father   . Colon cancer Maternal Grandmother   . Colon cancer Paternal Grandfather   . Prostate cancer  Paternal Grandfather        also lung cancer    Social History   Tobacco Use  . Smoking status: Former Smoker    Packs/day: 0.10    Years: 2.00    Pack years: 0.20    Quit date: 05/18/2012    Years since quitting: 8.0  . Smokeless tobacco: Never Used  Vaping Use  . Vaping Use: Every day  Substance Use Topics  . Alcohol use: Yes    Comment: socially  . Drug use: Never    Home Medications Prior to Admission medications   Medication Sig Start Date End Date Taking? Authorizing Provider  clonazePAM (KLONOPIN) 1 MG tablet 1 mg 4 (four) times daily as needed. 05/22/18  Yes [provider]  ibuprofen (ADVIL,MOTRIN) 600 MG tablet Take 1 tablet (600 mg total) by mouth every 6 (six) hours. 09/02/16  Yes Jerelyn Charles, MD  lamoTRIgine (LAMICTAL) 100 MG tablet Take 100 mg by mouth daily.   Yes  [provider]  venlafaxine XR (EFFEXOR-XR) 75 MG 24 hr capsule Take 75 mg by mouth 2 (two) times daily. 05/01/18  Yes [provider]  acetaminophen (TYLENOL) 325 MG tablet Take 325-650 mg by mouth every 6 (six) hours as needed (for pain/headache.).    [provider]  carbamazepine (CARBATROL) 300 MG 12 hr capsule Take 350 mg by mouth 2 (two) times daily. 05/01/18   [provider]  predniSONE (DELTASONE) 50 MG tablet Take 1 tablet (50 mg total) by mouth daily with breakfast. 07/04/19   Tasia Catchings, Amy V, PA-C  zolpidem (AMBIEN) 10 MG tablet Take 10 mg by mouth at bedtime.    [provider]    Allergies    Amoxicillin, Penicillins, and Latex  Review of Systems   Review of Systems  Constitutional: Negative for diaphoresis and fever.  HENT: Negative for sore throat.   Eyes: Negative for visual disturbance.  Respiratory: Positive for shortness of breath. Negative for cough.   Cardiovascular: Positive for chest pain.  Gastrointestinal: Positive for diarrhea. Negative for abdominal pain, nausea and vomiting.  Genitourinary: Positive for flank pain. Negative for dysuria.  Musculoskeletal: Positive for back pain.  Skin: Negative for rash.  Neurological: Negative for headaches.    Physical Exam Updated Vital Signs BP 120/72 (BP Location: Left Arm)   Pulse 78   Temp 98.4 F (36.9 C) (Oral)   Resp 11   Ht 5\' 6"  (1.676 m)   Wt 132 kg   LMP 05/06/2020 (Approximate)   SpO2 99%   BMI 46.97 kg/m   Physical Exam Vitals and nursing note reviewed.  Constitutional:      General: She is not in acute distress.    Appearance: She is well-developed. She is obese.  HENT:     Head: Normocephalic and atraumatic.  Eyes:     Conjunctiva/sclera: Conjunctivae normal.  Cardiovascular:     Rate and Rhythm: Normal rate and regular rhythm.     Heart sounds: No murmur heard.   Pulmonary:     Effort: Pulmonary effort is normal. No respiratory distress.      Breath sounds: Normal breath sounds. No stridor. No wheezing.  Abdominal:     Palpations: Abdomen is soft. There is no mass.     Tenderness: There is no abdominal tenderness. There is no guarding or rebound.  Musculoskeletal:        General: No tenderness. Normal range of motion.     Cervical back: Neck supple.  Right lower leg: No tenderness.     Left lower leg: No tenderness.  Skin:    General: Skin is warm and dry.     Capillary Refill: Capillary refill takes less than 2 seconds.  Neurological:     General: No focal deficit present.     Mental Status: She is alert.     GCS: GCS eye subscore is 4. GCS verbal subscore is 5. GCS motor subscore is 6.     ED Results / Procedures / Treatments   Labs (all labs ordered are listed, but only abnormal results are displayed) Labs Reviewed  COMPREHENSIVE METABOLIC PANEL - Abnormal; Notable for the following components:      Result Value   Glucose, Bld 128 (*)    Calcium 8.8 (*)    Total Protein 6.2 (*)    All other components within normal limits  URINALYSIS, ROUTINE W REFLEX MICROSCOPIC - Abnormal; Notable for the following components:   APPearance HAZY (*)    Hgb urine dipstick SMALL (*)    All other components within normal limits  URINALYSIS, MICROSCOPIC (REFLEX) - Abnormal; Notable for the following components:   Bacteria, UA FEW (*)    All other components within normal limits  LIPASE, BLOOD  CBC WITH DIFFERENTIAL/PLATELET  PREGNANCY, URINE  TROPONIN I (HIGH SENSITIVITY)    EKG EKG Interpretation  Date/Time:  Wednesday May 21 2020 07:12:19 EDT Ventricular Rate:  77 PR Interval:  145 QRS Duration: 99 QT Interval:  417 QTC Calculation: 472 R Axis:   86 Text Interpretation: Sinus rhythm No old tracing to compare Confirmed by Aletta Edouard [phone]) on 05/21/2020 7:16:58 AM   Radiology DG Chest Port 1 View  Result Date: 05/21/2020 CLINICAL DATA:  Chest pain. EXAM: PORTABLE CHEST 1 VIEW COMPARISON:  No prior.  FINDINGS: Mediastinum and hilar structures normal. Heart size normal. No focal infiltrate. No pleural effusion or pneumothorax. No acute bony abnormality. IMPRESSION: No acute cardiopulmonary disease. Electronically Signed   By: Marcello Moores  Register   On: 05/21/2020 07:40   CT Renal Stone Study  Result Date: 05/21/2020 CLINICAL DATA:  Left flank pain EXAM: CT ABDOMEN AND PELVIS WITHOUT CONTRAST TECHNIQUE: Multidetector CT imaging of the abdomen and pelvis was performed following the standard protocol without oral or IV contrast. COMPARISON:  July 06, 2018 FINDINGS: Lower chest: There is bibasilar atelectasis. Lung bases otherwise are clear. Hepatobiliary: No focal liver lesions are appreciable on this noncontrast enhanced study. Gallbladder wall is not appreciably thickened. There is no biliary duct dilatation. Pancreas: There is no pancreatic mass or inflammatory focus. Spleen: No splenic lesions are appreciable. Adrenals/Urinary Tract: Adrenals bilaterally appear normal. There is no appreciable renal mass or hydronephrosis on either side. There is no appreciable renal or ureteral calculus on either side. Urinary bladder is midline with wall thickness within normal limits. Several phleboliths are noted in the pelvis, separate from the distal ureters. Stomach/Bowel: There is no appreciable bowel wall or mesenteric thickening. There is no appreciable bowel obstruction. The terminal ileum appears normal. Appendix appears normal. No evident free air or portal venous air. Vascular/Lymphatic: No abdominal aortic aneurysm. No vascular lesions evident on noncontrast enhanced study. No evident adenopathy in the abdomen or pelvis. Reproductive: Uterus is anteverted. There is an intrauterine device positioned within the endometrium. No appreciable adnexal masses. Other: No abscess or ascites evident in the abdomen or pelvis. There is thinning of the rectus muscle in the midline abdomen region. Musculoskeletal: There is  degenerative change in the lumbar spine.  No blastic or lytic bone lesions. Small bone island in the left intertrochanteric femur region, stable. No abdominal wall or intramuscular lesions. IMPRESSION: 1. No evident renal or ureteral calculus. No hydronephrosis on either side. Urinary bladder wall thickness normal. 2. No bowel wall thickening or bowel obstruction. No abscess in the abdomen or pelvis. Appendix appears normal. 3.  Intrauterine device positioned within the endometrium. Electronically Signed   By: Lowella Grip III M.D.   On: 05/21/2020 09:40    Procedures Procedures   Medications Ordered in ED Medications  sodium chloride 0.9 % bolus 1,000 mL (has no administration in time range)    ED Course  I have reviewed the triage vital signs and the nursing notes.  Pertinent labs & imaging results that were available during my care of the patient were reviewed by me and considered in my medical decision making (see chart for details).  Clinical Course as of 05/21/20 1658  Wed May 21, 2020  0730 Patient's Wells score is 0. [MB]  4098 Chest x-ray interpreted by me as no acute infiltrates. [MB]  [id number] Reviewed results of work-up with patient.  She is comfortable plan for discharge and outpatient follow-up up with her provider. [MB]    Clinical Course User Index [MB] Hayden Rasmussen, MD   MDM Rules/Calculators/A&P                         This patient complains of chest pain flank pain decreased urination ongoing menses; this involves an extensive number of treatment Options and is a complaint that carries with it a high risk of complications and Morbidity. The differential includes ACS, pneumonia, pneumothorax, PE, vascular, musculoskeletal, renal colic pyelonephritis anemia, metabolic derangement  I ordered, reviewed and interpreted labs, which included CBC with normal white count normal hemoglobin, chemistries fairly normal other than mildly elevated glucose, urinalysis without  signs of infection, pregnancy test negative, troponin unremarkable I ordered medication IV fluids with some improvement in her symptoms I ordered imaging studies which included chest x-ray and CT renal and I independently    visualized and interpreted imaging which showed no acute findings Previous records obtained and reviewed in epic, no recent ED visits or admissions  After the interventions stated above, I reevaluated the patient and found patient to be hemodynamically stable.  I reviewed her work-up with her.  She is comfortable plan for working on getting a primary care doctor and following up with them.  Return instructions discussed   Final Clinical Impression(s) / ED Diagnoses Final diagnoses:  Flank pain  Nonspecific chest pain    Rx / DC Orders ED Discharge Orders    None       Hayden Rasmussen, MD 05/21/20 1701

## 2020-05-21 NOTE — Discharge Instructions (Addendum)
You were seen in the emergency department for chest pain and flank pain.  You had blood work EKG chest x-ray urinalysis and a CAT scan of your abdomen and pelvis that did not show any cause of your symptoms.  Please contact your primary care doctor for close follow-up.  Return to the emergency department for any worsening or concerning symptoms.

## 2020-05-21 NOTE — ED Triage Notes (Signed)
Pt via pov from home with chest pain x 2.5 weeks and left flank pain x 2.5 years. Pt states both are worse today. Pt has hx of enlarged spleen. Pt states she was told to follow up on the enlarged spleen but has not had insurance to do so. Pt denies any hematuria or increased urination. Pt alert & oriented, nad noted.

## 2021-03-11 ENCOUNTER — Ambulatory Visit: Payer: BLUE CROSS/BLUE SHIELD | Admitting: Rheumatology

## 2021-04-01 ENCOUNTER — Ambulatory Visit: Payer: BLUE CROSS/BLUE SHIELD | Admitting: Rheumatology

## 2021-04-08 ENCOUNTER — Ambulatory Visit: Payer: BLUE CROSS/BLUE SHIELD | Admitting: Internal Medicine

## 2021-04-24 ENCOUNTER — Emergency Department (HOSPITAL_BASED_OUTPATIENT_CLINIC_OR_DEPARTMENT_OTHER): Payer: No Typology Code available for payment source

## 2021-04-24 ENCOUNTER — Encounter (HOSPITAL_BASED_OUTPATIENT_CLINIC_OR_DEPARTMENT_OTHER): Payer: Self-pay | Admitting: Emergency Medicine

## 2021-04-24 ENCOUNTER — Other Ambulatory Visit: Payer: Self-pay

## 2021-04-24 ENCOUNTER — Emergency Department (HOSPITAL_BASED_OUTPATIENT_CLINIC_OR_DEPARTMENT_OTHER)
Admission: EM | Admit: 2021-04-24 | Discharge: 2021-04-24 | Disposition: A | Discharge status: Home / Self Care | Payer: No Typology Code available for payment source | Attending: Emergency Medicine | Admitting: Emergency Medicine

## 2021-04-24 DIAGNOSIS — Z79899 Other long term (current) drug therapy: Secondary | ICD-10-CM | POA: Diagnosis not present

## 2021-04-24 DIAGNOSIS — N939 Abnormal uterine and vaginal bleeding, unspecified: Secondary | ICD-10-CM | POA: Diagnosis not present

## 2021-04-24 DIAGNOSIS — R1032 Left lower quadrant pain: Secondary | ICD-10-CM | POA: Diagnosis not present

## 2021-04-24 DIAGNOSIS — Z91040 Latex allergy status: Secondary | ICD-10-CM | POA: Diagnosis not present

## 2021-04-24 LAB — COMPREHENSIVE METABOLIC PANEL
ALT: 18 U/L (ref 0–44)
AST: 16 U/L (ref 15–41)
Albumin: 4 g/dL (ref 3.5–5.0)
Alkaline Phosphatase: 52 U/L (ref 38–126)
Anion gap: 10 (ref 5–15)
BUN: 11 mg/dL (ref 6–20)
CO2: 22 mmol/L (ref 22–32)
Calcium: 8.6 mg/dL — ABNORMAL LOW (ref 8.9–10.3)
Chloride: 99 mmol/L (ref 98–111)
Creatinine, Ser: 0.82 mg/dL (ref 0.44–1.00)
GFR, Estimated: >60 mL/min (ref >60)
Glucose, Bld: 97 mg/dL (ref 70–99)
Potassium: 3.5 mmol/L (ref 3.5–5.1)
Sodium: 131 mmol/L — ABNORMAL LOW (ref 135–145)
Total Bilirubin: 0.8 mg/dL (ref 0.3–1.2)
Total Protein: 7.5 g/dL (ref 6.5–8.1)

## 2021-04-24 LAB — URINALYSIS, ROUTINE W REFLEX MICROSCOPIC
Bilirubin Urine: NEGATIVE
Glucose, UA: NEGATIVE mg/dL
Ketones, ur: 15 mg/dL — AB
Leukocytes,Ua: NEGATIVE
Nitrite: NEGATIVE
Protein, ur: 30 mg/dL — AB
Specific Gravity, Urine: 1.02 (ref 1.005–1.030)
pH: 8.5 — ABNORMAL HIGH (ref 5.0–8.0)

## 2021-04-24 LAB — CBC WITH DIFFERENTIAL/PLATELET
Abs Immature Granulocytes: 0.03 10*3/uL (ref 0.00–0.07)
Basophils Absolute: 0 10*3/uL (ref 0.0–0.1)
Basophils Relative: 0 %
Eosinophils Absolute: 0.2 10*3/uL (ref 0.0–0.5)
Eosinophils Relative: 2 %
HCT: 36.1 % (ref 36.0–46.0)
Hemoglobin: 12.5 g/dL (ref 12.0–15.0)
Immature Granulocytes: 0 %
Lymphocytes Relative: 12 %
Lymphs Abs: 1.4 10*3/uL (ref 0.7–4.0)
MCH: 31.6 pg (ref 26.0–34.0)
MCHC: 34.6 g/dL (ref 30.0–36.0)
MCV: 91.4 fL (ref 80.0–100.0)
Monocytes Absolute: 0.6 10*3/uL (ref 0.1–1.0)
Monocytes Relative: 5 %
Neutro Abs: 9.3 10*3/uL — ABNORMAL HIGH (ref 1.7–7.7)
Neutrophils Relative %: 81 %
Platelets: 221 10*3/uL (ref 150–400)
RBC: 3.95 MIL/uL (ref 3.87–5.11)
RDW: 13.2 % (ref 11.5–15.5)
WBC: 11.6 10*3/uL — ABNORMAL HIGH (ref 4.0–10.5)
nRBC: 0 % (ref 0.0–0.2)

## 2021-04-24 LAB — WET PREP, GENITAL
Clue Cells Wet Prep HPF POC: NONE SEEN
Sperm: NONE SEEN
Trich, Wet Prep: NONE SEEN
WBC, Wet Prep HPF POC: <10 (ref <10)
Yeast Wet Prep HPF POC: NONE SEEN

## 2021-04-24 LAB — URINALYSIS, MICROSCOPIC (REFLEX)

## 2021-04-24 LAB — PREGNANCY, URINE: Preg Test, Ur: NEGATIVE

## 2021-04-24 LAB — LIPASE, BLOOD: Lipase: 32 U/L (ref 11–51)

## 2021-04-24 MED ORDER — IOHEXOL 300 MG/ML  SOLN
100.0000 mL | Freq: Once | INTRAMUSCULAR | Status: AC | PRN
  Administered 2021-04-24: 100 mL via INTRAVENOUS

## 2021-04-24 MED ORDER — HYDROMORPHONE HCL 1 MG/ML IJ SOLN
0.5000 mg | Freq: Once | INTRAMUSCULAR | Status: AC
  Administered 2021-04-24: 0.5 mg via INTRAVENOUS
  Filled 2021-04-24: qty 1

## 2021-04-24 MED ORDER — ONDANSETRON HCL 4 MG/2ML IJ SOLN
4.0000 mg | Freq: Once | INTRAMUSCULAR | Status: AC
Start: 2021-04-24 — End: 2021-04-24
  Administered 2021-04-24: 4 mg via INTRAVENOUS
  Filled 2021-04-24: qty 2

## 2021-04-24 NOTE — ED Triage Notes (Signed)
Recurrent heavy vaginal bleeding , Hx fibroids and ovarian cyst. Also reports left flank pain .  ?

## 2021-04-24 NOTE — Discharge Instructions (Signed)
Please follow up with your OBGYN. If your symptoms significantly worsen, you can return to the ED.  ?

## 2021-04-24 NOTE — ED Provider Notes (Signed)
?[facility] EMERGENCY DEPARTMENT ?Provider Note ? ? ?CSN: [id number] ?Arrival date & time: 04/24/21  1324 ? ?  ? ?History ?PMH: PCOS, Anemia, ovarian cyst, fibroids ?Chief Complaint  ?Patient presents with  ? Vaginal Bleeding  ? ? ?Kathleen Sanders is a 31 y.o. female.  Patient has vaginal bleeding.  She says around 4 AM this morning she woke up suddenly with sudden onset left lower quadrant abdominal pain that radiates into her left flank.  She says she was heading to the bathroom at this time and felt a gush in her vaginal area.  When she got to the toilet, she noticed a very large clot of blood.  Ever since then, she has continued to have vaginal bleeding.  Her last menstrual cycle was 12 days ago and is not time for her to have bleeding.  She says she has filled up 3 tampons since 8 AM.  The left lower quadrant pain has persisted.  She has never had this happen before even with her PCOS issues.  She has associated dizziness, and feels like she is more pale than normal. Also thinks that her heart has been racing. She is sexually active and got off her IUD back in December.  She says she has not been using protection since, but does not think she could be pregnant.  She says she took 4 years to get pregnant with her last child so she is fairly infertile.  Her most recent periods have been normal.  She also states that before this started, she has been having some abnormal vaginal discharge.  She describes it to be brownish in color.  Denies any fevers or chills. ? ? ? ?Vaginal Bleeding ?Associated symptoms: abdominal pain and vaginal discharge   ?Associated symptoms: no dysuria, no fever and no nausea   ? ?  ? ?Home Medications ?Prior to Admission medications   ?Medication Sig Start Date End Date Taking? Authorizing Provider  ?acetaminophen (TYLENOL) 325 MG tablet Take 325-650 mg by mouth every 6 (six) hours as needed (for pain/headache.).    [provider]  ?carbamazepine (CARBATROL) 300 MG 12 hr  capsule Take 350 mg by mouth 2 (two) times daily. 05/01/18   [provider]  ?clonazePAM (KLONOPIN) 1 MG tablet 1 mg 4 (four) times daily as needed. 05/22/18   [provider]  ?ibuprofen (ADVIL,MOTRIN) 600 MG tablet Take 1 tablet (600 mg total) by mouth every 6 (six) hours. 09/02/16   Jerelyn Charles, MD  ?lamoTRIgine (LAMICTAL) 100 MG tablet Take 100 mg by mouth daily.    [provider]  ?predniSONE (DELTASONE) 50 MG tablet Take 1 tablet (50 mg total) by mouth daily with breakfast. 07/04/19   Tasia Catchings, Amy V, PA-C  ?venlafaxine XR (EFFEXOR-XR) 75 MG 24 hr capsule Take 75 mg by mouth 2 (two) times daily. 05/01/18   [provider]  ?zolpidem (AMBIEN) 10 MG tablet Take 10 mg by mouth at bedtime.    [provider]  ?   ? ?Allergies    ?Amoxicillin, Penicillins, and Latex   ? ?Review of Systems   ?Review of Systems  ?Constitutional:  Negative for chills and fever.  ?Respiratory:  Negative for shortness of breath.   ?Cardiovascular:  Positive for palpitations. Negative for chest pain.  ?Gastrointestinal:  Positive for abdominal pain. Negative for blood in stool, constipation, diarrhea, nausea and vomiting.  ?Genitourinary:  Positive for flank pain, pelvic pain, vaginal bleeding and vaginal discharge. Negative for dysuria and hematuria.  ?All  other systems reviewed and are negative. ? ?Physical Exam ?Updated Vital Signs ?BP 118/61 (BP Location: Left Arm)   Pulse 84   Temp 99.6 ?F (37.6 ?C) (Oral)   Resp 18   Ht 5' 6.5" (1.689 m)   Wt 131.5 kg   LMP 04/15/2021 (Exact Date)   SpO2 97%   BMI 46.11 kg/m?  ?Physical Exam ?Vitals and nursing note reviewed. Exam conducted with a chaperone present.  ?Constitutional:   ?   General: She is not in acute distress. ?   Appearance: Normal appearance. She is not ill-appearing, toxic-appearing or diaphoretic.  ?HENT:  ?   Head: Normocephalic and atraumatic.  ?   Nose: No nasal deformity.  ?   Mouth/Throat:  ?   Lips: Pink. No lesions.  ?    Mouth: Mucous membranes are moist. No injury, lacerations, oral lesions or angioedema.  ?   Pharynx: Oropharynx is clear. Uvula midline. No pharyngeal swelling, oropharyngeal exudate, posterior oropharyngeal erythema or uvula swelling.  ?Eyes:  ?   General: Gaze aligned appropriately. No scleral icterus.    ?   Right eye: No discharge.     ?   Left eye: No discharge.  ?   Conjunctiva/sclera: Conjunctivae normal.  ?   Right eye: Right conjunctiva is not injected. No exudate or hemorrhage. ?   Left eye: Left conjunctiva is not injected. No exudate or hemorrhage. ?   Pupils: Pupils are equal, round, and reactive to light.  ?Cardiovascular:  ?   Rate and Rhythm: Normal rate and regular rhythm.  ?   Pulses: Normal pulses.     ?     Radial pulses are 2+ on the right side and 2+ on the left side.  ?     Dorsalis pedis pulses are 2+ on the right side and 2+ on the left side.  ?   Heart sounds: Normal heart sounds, S1 normal and S2 normal. Heart sounds not distant. No murmur heard. ?  No friction rub. No gallop. No S3 or S4 sounds.  ?Pulmonary:  ?   Effort: Pulmonary effort is normal. No accessory muscle usage or respiratory distress.  ?   Breath sounds: Normal breath sounds. No stridor. No wheezing, rhonchi or rales.  ?Chest:  ?   Chest wall: No tenderness.  ?Abdominal:  ?   General: Abdomen is flat. Bowel sounds are normal. There is no distension.  ?   Palpations: Abdomen is soft. There is no mass or pulsatile mass.  ?   Tenderness: There is abdominal tenderness. There is left CVA tenderness and guarding. There is no right CVA tenderness or rebound.  ?   Comments: LLQ abdominal tenderness with guarding present. Wraps around to left flank. Some more moderate suprapubic tenderness present as well.  ?Genitourinary: ?   Exam position: Lithotomy position.  ?   Vagina: Bleeding present. No vaginal discharge, erythema or tenderness.  ?   Cervix: Cervical bleeding present. No cervical motion tenderness.  ?   Adnexa:     ?   Right:  No tenderness.      ?   Left: No tenderness.    ?Musculoskeletal:  ?   Right lower leg: No edema.  ?   Left lower leg: No edema.  ?Skin: ?   General: Skin is warm and dry.  ?   Coloration: Skin is not jaundiced or pale.  ?   Findings: No bruising, erythema, lesion or rash.  ?Neurological:  ?   General: No  focal deficit present.  ?   Mental Status: She is alert and oriented to person, place, and time.  ?   GCS: GCS eye subscore is 4. GCS verbal subscore is 5. GCS motor subscore is 6.  ?Psychiatric:     ?   Mood and Affect: Mood normal.     ?   Behavior: Behavior normal. Behavior is cooperative.  ? ? ?ED Results / Procedures / Treatments   ?Labs ?(all labs ordered are listed, but only abnormal results are displayed) ?Labs Reviewed  ?COMPREHENSIVE METABOLIC PANEL - Abnormal; Notable for the following components:  ?    Result Value  ? Sodium 131 (*)   ? Calcium 8.6 (*)   ? All other components within normal limits  ?CBC WITH DIFFERENTIAL/PLATELET - Abnormal; Notable for the following components:  ? WBC 11.6 (*)   ? Neutro Abs 9.3 (*)   ? All other components within normal limits  ?URINALYSIS, ROUTINE W REFLEX MICROSCOPIC - Abnormal; Notable for the following components:  ? pH 8.5 (*)   ? Hgb urine dipstick TRACE (*)   ? Ketones, ur 15 (*)   ? Protein, ur 30 (*)   ? All other components within normal limits  ?URINALYSIS, MICROSCOPIC (REFLEX) - Abnormal; Notable for the following components:  ? Bacteria, UA FEW (*)   ? All other components within normal limits  ?WET PREP, GENITAL  ?PREGNANCY, URINE  ?LIPASE, BLOOD  ?GC/CHLAMYDIA PROBE AMP ([place]) NOT AT [facility]  ? ? ?EKG ?None ? ?Radiology ?US PELVIS TRANSVAGINAL NON-OB (TV ONLY) ? ?Result Date: 04/24/2021 ?CLINICAL DATA:  Irregular heavy bleeding for 3 days. EXAM: TRANSABDOMINAL AND TRANSVAGINAL ULTRASOUND OF PELVIS TECHNIQUE: Both transabdominal and transvaginal ultrasound examinations of the pelvis were performed. Transabdominal technique was performed for global  imaging of the pelvis including uterus, ovaries, adnexal regions, and pelvic cul-de-sac. It was necessary to proceed with endovaginal exam following the transabdominal exam to visualize the endometrium. COMPARISON:

## 2021-04-27 LAB — GC/CHLAMYDIA PROBE AMP (~~LOC~~) NOT AT ARMC
Chlamydia: NEGATIVE
Comment: NEGATIVE
Comment: NORMAL
Neisseria Gonorrhea: NEGATIVE

## 2021-08-03 ENCOUNTER — Inpatient Hospital Stay (HOSPITAL_COMMUNITY): Payer: No Typology Code available for payment source

## 2021-08-03 ENCOUNTER — Inpatient Hospital Stay (HOSPITAL_COMMUNITY)
Admission: AD | Admit: 2021-08-03 | Discharge: 2021-08-03 | Disposition: A | Discharge status: Home / Self Care | Payer: No Typology Code available for payment source | Attending: Obstetrics and Gynecology | Admitting: Obstetrics and Gynecology

## 2021-08-03 ENCOUNTER — Encounter (HOSPITAL_COMMUNITY): Payer: Self-pay | Admitting: *Deleted

## 2021-08-03 DIAGNOSIS — Z3A10 10 weeks gestation of pregnancy: Secondary | ICD-10-CM | POA: Diagnosis not present

## 2021-08-03 DIAGNOSIS — Z880 Allergy status to penicillin: Secondary | ICD-10-CM | POA: Insufficient documentation

## 2021-08-03 DIAGNOSIS — R109 Unspecified abdominal pain: Secondary | ICD-10-CM | POA: Insufficient documentation

## 2021-08-03 DIAGNOSIS — O3680X0 Pregnancy with inconclusive fetal viability, not applicable or unspecified: Secondary | ICD-10-CM

## 2021-08-03 DIAGNOSIS — O209 Hemorrhage in early pregnancy, unspecified: Secondary | ICD-10-CM | POA: Diagnosis present

## 2021-08-03 DIAGNOSIS — Z87891 Personal history of nicotine dependence: Secondary | ICD-10-CM | POA: Insufficient documentation

## 2021-08-03 HISTORY — DX: Tubulo-interstitial nephritis, not specified as acute or chronic: N12

## 2021-08-03 HISTORY — DX: Benign neoplasm of connective and other soft tissue, unspecified: D21.9

## 2021-08-03 HISTORY — DX: Urinary tract infection, site not specified: N39.0

## 2021-08-03 HISTORY — DX: Fibromyalgia: M79.7

## 2021-08-03 LAB — URINALYSIS, ROUTINE W REFLEX MICROSCOPIC
Bilirubin Urine: NEGATIVE
Glucose, UA: NEGATIVE mg/dL
Ketones, ur: NEGATIVE mg/dL
Leukocytes,Ua: NEGATIVE
Nitrite: NEGATIVE
Protein, ur: NEGATIVE mg/dL
Specific Gravity, Urine: 1.026 (ref 1.005–1.030)
pH: 5 (ref 5.0–8.0)

## 2021-08-03 LAB — CBC
HCT: 35.4 % — ABNORMAL LOW (ref 36.0–46.0)
Hemoglobin: 12.3 g/dL (ref 12.0–15.0)
MCH: 31.5 pg (ref 26.0–34.0)
MCHC: 34.7 g/dL (ref 30.0–36.0)
MCV: 90.8 fL (ref 80.0–100.0)
Platelets: 261 10*3/uL (ref 150–400)
RBC: 3.9 MIL/uL (ref 3.87–5.11)
RDW: 13.1 % (ref 11.5–15.5)
WBC: 9.3 10*3/uL (ref 4.0–10.5)
nRBC: 0 % (ref 0.0–0.2)

## 2021-08-03 LAB — POCT PREGNANCY, URINE: Preg Test, Ur: POSITIVE — AB

## 2021-08-03 LAB — HCG, QUANTITATIVE, PREGNANCY: hCG, Beta Chain, Quant, S: 413 m[IU]/mL — ABNORMAL HIGH (ref <5)

## 2021-08-03 NOTE — MAU Note (Signed)
Parminder Trapani is a 31 y.o. at Unknown here in MAU reporting: pain started yesterday, upper abd going all the way around and down. Pain has continued. Fever 100.6 this morning, eased off after taking Tylenol.  Started bleeding at 0800, was bright red blood.  Did not put a pad on, just kept going to the bathroom and wiping, now is more of a pinkish brown.  Passed one small clot when the bleeding started. Has been very shaky, kind of off balance, legs feel like jello.  4 +HPT 7/3.  LMP: unknown, no "actual period" since April or early  May.  Neg preg in Epic in April Onset of complaint: last night Pain score: between Mod/severe Vitals:   08/03/21 1725  BP: 123/77  Pulse: 82  Resp: 20  Temp: 98.6 F (37 C)  SpO2: 97%      Lab orders placed from triage:  UPT

## 2021-08-03 NOTE — Discharge Instructions (Signed)

## 2021-08-03 NOTE — MAU Provider Note (Signed)
History     CSN: [id number]  Arrival date and time: 08/03/21 1705   Event Date/Time   First Provider Initiated Contact with Patient 08/03/21 1808      Chief Complaint  Patient presents with   Abdominal Pain   Vaginal Bleeding   Possible Pregnancy   HPI  Kathleen Sanders is a 31 y.o. G3P2002 at 66w4dwho presents for evaluation of abdominal pain and vaginal bleeding. Patient reports the pain started yesterday and is diffuse all over her abdomen. Patient rates the pain as a 5/10 and has tried tylenol for the pain with no relief. She reports today she had a big gush of bright red bleeding that has since turned brown. She denies any discharge and leaking of fluid. Denies any constipation, diarrhea or any urinary complaints.  OB History     Gravida  3   Para  2   Term  2   Preterm  0   AB  0   Living  2      SAB  0   IAB  0   Ectopic  0   Multiple  0   Live Births  2           Past Medical History:  Diagnosis Date   Anemia    Asthma    seasonal   [place] 1 disorder [place]    [place]ipolar disorder ([facility]    Fibroid    Fibromyalgia    Gestational diabetes    Hx of chronic pyelonephritis    Migraine with aura    OCD (obsessive compulsive disorder)    PCOS (polycystic ovarian syndrome)    Pyelonephritis    UTI (urinary tract infection)     Past Surgical History:  Procedure Laterality Date   CESAREAN SECTION N/A 08/31/2016   Procedure: CESAREAN SECTION;  Surgeon: RVanessa Kick MD;  [facility]  Service: Obstetrics;  Laterality: N/A;    Family History  Problem Relation Age of Onset   Heart disease Mother        heart attacks, had triple bipass   Hypertension Mother    Hypertension Father    Prostate cancer Father    Colon cancer Maternal Grandmother    Colon cancer Paternal Grandfather    Prostate cancer Paternal Grandfather        also lung cancer    Social History   Tobacco Use   Smoking status: Former    Packs/day: 0.10     Years: 2.00    Total pack years: 0.20    Types: Cigarettes    Quit date: 05/18/2012    Years since quitting: 9.2   Smokeless tobacco: Never  Vaping Use   Vaping Use: Former  Substance Use Topics   Alcohol use: Not Currently    Comment: socially   Drug use: Never    Allergies:  Allergies  Allergen Reactions   Amoxicillin Nausea And Vomiting    Has patient had a PCN reaction causing immediate rash, facial/tongue/throat swelling, SOB or lightheadedness with hypotension: No Has patient had a PCN reaction causing severe rash involving mucus membranes or skin necrosis: No Has patient had a PCN reaction that required hospitalization: No Has patient had a PCN reaction occurring within the last 10 years: Unknown If all of the above answers are "NO", then may proceed with Cephalosporin use.    Penicillins Nausea And Vomiting    Has patient had a PCN reaction causing immediate rash, facial/tongue/throat swelling, SOB or lightheadedness with hypotension:  No Has patient had a PCN reaction causing severe rash involving mucus membranes or skin necrosis: No Has patient had a PCN reaction that required hospitalization: No Has patient had a PCN reaction occurring within the last 10 years: Unknown If all of the above answers are "NO", then may proceed with Cephalosporin use.     Latex Swelling and Rash    No medications prior to admission.    Review of Systems  Constitutional: Negative.  Negative for fatigue and fever.  HENT: Negative.    Respiratory: Negative.  Negative for shortness of breath.   Cardiovascular: Negative.  Negative for chest pain.  Gastrointestinal:  Positive for abdominal pain. Negative for constipation, diarrhea, nausea and vomiting.  Genitourinary:  Positive for vaginal bleeding. Negative for dysuria and vaginal discharge.  Neurological: Negative.  Negative for dizziness and headaches.   Physical Exam   Blood pressure 130/75, pulse 82, temperature 98.6 F (37 C),  temperature source Oral, resp. rate 18, height '5\' 6"'$  (1.676 m), weight 135.9 kg, last menstrual period 05/21/2021, SpO2 100 %.  Patient Vitals for the past 24 hrs:  BP Temp Temp src Pulse Resp SpO2 Height Weight  08/03/21 1958 130/75 -- -- 82 18 100 % -- --  08/03/21 1725 123/77 98.6 F (37 C) Oral 82 20 97 % '5\' 6"'$  (1.676 m) 135.9 kg    Physical Exam Vitals and nursing note reviewed.  Constitutional:      General: She is not in acute distress.    Appearance: She is well-developed.  HENT:     Head: Normocephalic.  Eyes:     Pupils: Pupils are equal, round, and reactive to light.  Cardiovascular:     Rate and Rhythm: Normal rate and regular rhythm.     Heart sounds: Normal heart sounds.  Pulmonary:     Effort: Pulmonary effort is normal. No respiratory distress.     Breath sounds: Normal breath sounds.  Abdominal:     General: Bowel sounds are normal. There is no distension.     Palpations: Abdomen is soft.     Tenderness: There is no abdominal tenderness.  Skin:    General: Skin is warm and dry.  Neurological:     Mental Status: She is alert and oriented to person, place, and time.  Psychiatric:        Mood and Affect: Mood normal.        Behavior: Behavior normal.        Thought Content: Thought content normal.        Judgment: Judgment normal.     MAU Course  Procedures  Results for orders placed or performed during the hospital encounter of 08/03/21 (from the past 24 hour(s))  Pregnancy, urine POC     Status: Abnormal   Collection Time: 08/03/21  5:34 PM  Result Value Ref Range   Preg Test, Ur POSITIVE (A) NEGATIVE  Urinalysis, Routine w reflex microscopic Urine, Clean Catch     Status: Abnormal   Collection Time: 08/03/21  5:37 PM  Result Value Ref Range   Color, Urine YELLOW YELLOW   APPearance CLEAR CLEAR   Specific Gravity, Urine 1.026 1.005 - 1.030   pH 5.0 5.0 - 8.0   Glucose, UA NEGATIVE NEGATIVE mg/dL   Hgb urine dipstick SMALL (A) NEGATIVE    Bilirubin Urine NEGATIVE NEGATIVE   Ketones, ur NEGATIVE NEGATIVE mg/dL   Protein, ur NEGATIVE NEGATIVE mg/dL   Nitrite NEGATIVE NEGATIVE   Leukocytes,Ua NEGATIVE NEGATIVE   RBC /  HPF 0-5 0 - 5 RBC/hpf   WBC, UA 0-5 0 - 5 WBC/hpf   Bacteria, UA RARE (A) NONE SEEN   Squamous Epithelial / LPF 0-5 0 - 5   Mucus PRESENT   CBC     Status: Abnormal   Collection Time: 08/03/21  5:51 PM  Result Value Ref Range   WBC 9.3 4.0 - 10.5 K/uL   RBC 3.90 3.87 - 5.11 MIL/uL   Hemoglobin 12.3 12.0 - 15.0 g/dL   HCT 35.4 (L) 36.0 - 46.0 %   MCV 90.8 80.0 - 100.0 fL   MCH 31.5 26.0 - 34.0 pg   MCHC 34.7 30.0 - 36.0 g/dL   RDW 13.1 11.5 - 15.5 %   Platelets 261 150 - 400 K/uL   nRBC 0.0 0.0 - 0.2 %  hCG, quantitative, pregnancy     Status: Abnormal   Collection Time: 08/03/21  5:51 PM  Result Value Ref Range   hCG, Beta Chain, Quant, S 413 (H) <5 mIU/mL     US OB LESS THAN 14 WEEKS WITH OB TRANSVAGINAL  Result Date: 08/03/2021 CLINICAL DATA:  Bleeding and abdominal pain over the last day. Beta HCG 413. EXAM: OBSTETRIC <14 WK [place] AND TRANSVAGINAL OB US TECHNIQUE: Both transabdominal and transvaginal ultrasound examinations were performed for complete evaluation of the gestation as well as the maternal uterus, adnexal regions, and pelvic cul-de-sac. Transvaginal technique was performed to assess early pregnancy. COMPARISON:  None FINDINGS: Intrauterine gestational sac: None No evidence of adnexal lesion to suggest ectopic pregnancy. No evidence of retained products of conception. Maternal uterus/adnexae: Normal appearing uterus with exception of a 2.6 x 2.6 x 2.3 cm posterior fundal leiomyoma. No ovarian lesion. No free fluid. IMPRESSION: No intrauterine gestational sac. No evidence of retained products of conception. No finding to suggest adnexal ectopic pregnancy. Clinical follow-up warranted. Posterior fundal leiomyoma. Electronically Signed   By: Nelson Chimes M.D.   On: 08/03/2021 19:34     MDM Labs  ordered and reviewed.   UA, UPT CBC, HCG ABO/Rh- A Pos Wet prep and gc/chlamydia US OB Comp Less 14 weeks with Transvaginal  CNM independently reviewed the imaging ordered. Imaging show no visible IUP  Discussed with client the diagnosis of pregnancy of unknown anatomic location.  Three possibilities of outcome are: a healthy pregnancy that is too early to see a yolk sac to confirm the pregnancy is in the uterus, a pregnancy that is not healthy and has not developed and will not develop, and an ectopic pregnancy that is in the abdomen that cannot be identified at this time.  And ectopic pregnancy can be a life threatening situation as a pregnancy needs to be in the uterus which is a muscle and can stretch to accommodate the growth of a pregnancy.  Other structures in the pelvis and abdomen as not muscular and do not stretch with the growth of a pregnancy.  Worst case scenario is that a structure ruptures with a growing pregnancy not in the uterus and and internal hemorrhage can be a life threatening situation.  We need to follow the progression of this pregnancy carefully.  We need to check another serum pregnancy hormone level to determine if the levels are rising appropriately  and to determine the next steps that are needed for you. Patient's questions were answered.  Patient to return Wednesday after work to MAU for repeat HCG, unable to miss work for labs.  Assessment and Plan   1. Pregnancy of unknown  anatomic location   2. [redacted] weeks gestation of pregnancy   3. Vaginal bleeding affecting early pregnancy     -Discharge home in stable condition -Ectopic precautions discussed -Patient advised to follow-up with MAU on Wednesday, appt made -Patient may return to MAU as needed or if her condition were to change or worsen  Wende Mott, CNM 08/03/2021, 8:22 PM

## 2021-08-05 ENCOUNTER — Other Ambulatory Visit (HOSPITAL_COMMUNITY): Admit: 2021-08-05 | Payer: No Typology Code available for payment source

## 2021-08-05 ENCOUNTER — Other Ambulatory Visit: Payer: Self-pay

## 2021-08-05 ENCOUNTER — Inpatient Hospital Stay (HOSPITAL_COMMUNITY)
Admission: AD | Admit: 2021-08-05 | Discharge: 2021-08-05 | Disposition: A | Discharge status: Home / Self Care | Payer: No Typology Code available for payment source | Attending: Obstetrics and Gynecology | Admitting: Obstetrics and Gynecology

## 2021-08-05 DIAGNOSIS — Z3A10 10 weeks gestation of pregnancy: Secondary | ICD-10-CM | POA: Insufficient documentation

## 2021-08-05 DIAGNOSIS — O039 Complete or unspecified spontaneous abortion without complication: Secondary | ICD-10-CM

## 2021-08-05 LAB — HCG, QUANTITATIVE, PREGNANCY: hCG, Beta Chain, Quant, S: 270 m[IU]/mL — ABNORMAL HIGH (ref <5)

## 2021-08-05 NOTE — MAU Provider Note (Signed)
Patient Tameka Hoiland is a 31 y.o.. G3P2002 At 29w6dhere for follow bhcg due to PUL diagnosis on 07/17. She reports that her pain on Monday was a 4-5/10 and now today it is a 3/10. She reports that she couldn't walk on Sunday and reports that it is now resolved. She reports some dull cramping today.   Bleeding on Monday was on and off again heavy and light; it has now turned brown. "It almost stopped completely"; she had no bleeding at all last night and then some pink and brown bleeding with brown spotting since 11 am.   Beta hcg dropped from 413 to 270, signifying miscarriage.  She has a HA that she thinks is related to stress and crying.   Patient Vitals for the past 24 hrs:  BP Temp Temp src Pulse Resp SpO2 Height Weight  08/05/21 1604 131/74 99.4 F (37.4 C) Oral 79 16 98 % -- --  08/05/21 1601 -- -- -- -- -- -- '5\' 6"'$  (1.676 m) 135.3 kg    Assessment and Plan   1. Miscarriage   -patient prefers expectant management infection precautions reviewed, patient verbalized understanding and [place].  -return precautions reviewed   KMervyn SkeetersKEncompass Health Rehabilitation [facility]ities7/19/2023, 6:15 PM

## 2021-08-05 NOTE — MAU Note (Signed)
Kathleen Sanders is a 31 y.o. at 13w6dhere in MAU reporting: here for follow up hcg. Having pain and bleeding. States bleeding goes back and forth between being heavy and light.  Onset of complaint: ongoing  Pain score: 3/10  Vitals:   08/05/21 1604  BP: 131/74  Pulse: 79  Resp: 16  Temp: 99.4 F (37.4 C)  SpO2: 98%     Lab orders placed from triage: hcg

## 2021-10-27 ENCOUNTER — Emergency Department (HOSPITAL_BASED_OUTPATIENT_CLINIC_OR_DEPARTMENT_OTHER)
Admission: EM | Admit: 2021-10-27 | Discharge: 2021-10-27 | Disposition: A | Discharge status: Home / Self Care | Payer: No Typology Code available for payment source | Attending: Emergency Medicine | Admitting: Emergency Medicine

## 2021-10-27 ENCOUNTER — Other Ambulatory Visit: Payer: Self-pay

## 2021-10-27 ENCOUNTER — Encounter (HOSPITAL_BASED_OUTPATIENT_CLINIC_OR_DEPARTMENT_OTHER): Payer: Self-pay | Admitting: Emergency Medicine

## 2021-10-27 DIAGNOSIS — G43809 Other migraine, not intractable, without status migrainosus: Secondary | ICD-10-CM | POA: Diagnosis not present

## 2021-10-27 DIAGNOSIS — J019 Acute sinusitis, unspecified: Secondary | ICD-10-CM | POA: Diagnosis not present

## 2021-10-27 DIAGNOSIS — J45909 Unspecified asthma, uncomplicated: Secondary | ICD-10-CM | POA: Diagnosis not present

## 2021-10-27 DIAGNOSIS — N390 Urinary tract infection, site not specified: Secondary | ICD-10-CM | POA: Diagnosis not present

## 2021-10-27 DIAGNOSIS — M797 Fibromyalgia: Secondary | ICD-10-CM | POA: Diagnosis not present

## 2021-10-27 DIAGNOSIS — R300 Dysuria: Secondary | ICD-10-CM | POA: Diagnosis present

## 2021-10-27 LAB — COMPREHENSIVE METABOLIC PANEL WITH GFR
ALT: 21 U/L (ref 0–44)
AST: 20 U/L (ref 15–41)
Albumin: 3.9 g/dL (ref 3.5–5.0)
Alkaline Phosphatase: 56 U/L (ref 38–126)
Anion gap: 7 (ref 5–15)
BUN: 9 mg/dL (ref 6–20)
CO2: 26 mmol/L (ref 22–32)
Calcium: 9 mg/dL (ref 8.9–10.3)
Chloride: 104 mmol/L (ref 98–111)
Creatinine, Ser: 0.81 mg/dL (ref 0.44–1.00)
GFR, Estimated: >60 mL/min (ref >60)
Glucose, Bld: 136 mg/dL — ABNORMAL HIGH (ref 70–99)
Potassium: 3.5 mmol/L (ref 3.5–5.1)
Sodium: 137 mmol/L (ref 135–145)
Total Bilirubin: 0.4 mg/dL (ref 0.3–1.2)
Total Protein: 6.9 g/dL (ref 6.5–8.1)

## 2021-10-27 LAB — CBC
HCT: 35.3 % — ABNORMAL LOW (ref 36.0–46.0)
Hemoglobin: 12.1 g/dL (ref 12.0–15.0)
MCH: 31.2 pg (ref 26.0–34.0)
MCHC: 34.3 g/dL (ref 30.0–36.0)
MCV: 91 fL (ref 80.0–100.0)
Platelets: 244 10*3/uL (ref 150–400)
RBC: 3.88 MIL/uL (ref 3.87–5.11)
RDW: 13.6 % (ref 11.5–15.5)
WBC: 10.7 10*3/uL — ABNORMAL HIGH (ref 4.0–10.5)
nRBC: 0 % (ref 0.0–0.2)

## 2021-10-27 LAB — URINALYSIS, MICROSCOPIC (REFLEX)

## 2021-10-27 LAB — URINALYSIS, ROUTINE W REFLEX MICROSCOPIC
Bilirubin Urine: NEGATIVE
Glucose, UA: NEGATIVE mg/dL
Ketones, ur: NEGATIVE mg/dL
Nitrite: NEGATIVE
Protein, ur: NEGATIVE mg/dL
Specific Gravity, Urine: 1.02 (ref 1.005–1.030)
pH: 6 (ref 5.0–8.0)

## 2021-10-27 LAB — PREGNANCY, URINE: Preg Test, Ur: NEGATIVE

## 2021-10-27 LAB — LIPASE, BLOOD: Lipase: 42 U/L (ref 11–51)

## 2021-10-27 LAB — MAGNESIUM: Magnesium: 1.7 mg/dL (ref 1.7–2.4)

## 2021-10-27 LAB — TROPONIN I (HIGH SENSITIVITY): Troponin I (High Sensitivity): <2 ng/L (ref <18)

## 2021-10-27 MED ORDER — FLUTICASONE PROPIONATE 50 MCG/ACT NA SUSP
2.0000 | NASAL | Status: AC
Start: 2021-10-27 — End: 2021-10-27
  Administered 2021-10-27: 2 via NASAL
  Filled 2021-10-27: qty 16

## 2021-10-27 MED ORDER — DIPHENHYDRAMINE HCL 25 MG PO CAPS
25.0000 mg | ORAL_CAPSULE | ORAL | Status: AC
  Administered 2021-10-27: 25 mg via ORAL
  Filled 2021-10-27: qty 1

## 2021-10-27 MED ORDER — NITROFURANTOIN MONOHYD MACRO 100 MG PO CAPS
100.0000 mg | ORAL_CAPSULE | ORAL | Status: AC
  Administered 2021-10-27: 100 mg via ORAL
  Filled 2021-10-27: qty 1

## 2021-10-27 MED ORDER — DIPHENHYDRAMINE HCL 50 MG/ML IJ SOLN
25.0000 mg | Freq: Once | INTRAMUSCULAR | Status: AC
  Administered 2021-10-27: 25 mg via INTRAVENOUS
  Filled 2021-10-27: qty 1

## 2021-10-27 MED ORDER — KETOROLAC TROMETHAMINE 15 MG/ML IJ SOLN
15.0000 mg | Freq: Once | INTRAMUSCULAR | Status: AC
  Administered 2021-10-27: 15 mg via INTRAVENOUS
  Filled 2021-10-27: qty 1

## 2021-10-27 MED ORDER — NITROFURANTOIN MONOHYD MACRO 100 MG PO CAPS: 100.0000 mg | ORAL_CAPSULE | Freq: Two times a day (BID) | ORAL | 0 refills | Status: DC

## 2021-10-27 MED ORDER — METOCLOPRAMIDE HCL 5 MG/ML IJ SOLN
10.0000 mg | Freq: Once | INTRAMUSCULAR | Status: AC
  Administered 2021-10-27: 10 mg via INTRAVENOUS
  Filled 2021-10-27: qty 2

## 2021-10-27 NOTE — Discharge Instructions (Signed)
Today you were seen in the emergency department for your medical concerns.    In the emergency department you were found to have a urinary tract infection and had your headache treated.    At home, please take the antibiotics (Macrobid) that we have prescribed you for your urinary tract infection.   Check your MyChart online for the results of any tests that had not resulted by the time you left the emergency department.   Follow-up with your primary doctor in 2-3 days regarding your visit.    Return immediately to the emergency department if you experience any of the following: fevers, flank pain, vomiting, or any other concerning symptoms.    Thank you for visiting our Emergency Department. It was a pleasure taking care of you today.

## 2021-10-27 NOTE — ED Provider Notes (Signed)
[place] EMERGENCY DEPARTMENT Provider Note   CSN: [id number] Arrival date & time: 10/27/21  1702     History  Chief Complaint  Patient presents with   Multiple Complaints    Kathleen Sanders is a 31 y.o. female.  31 yo F with hx of asthma, BP1, fibromyalgia, UTI, and migraines who presents with multiple complaints. See below for individual complaints.  She states that these have been getting worse since she started working 70 hours a week and that she has not been able to follow-up with her primary care doctor recently so she decided to come into the emergency department for evaluation.  L side pain -says that she has been having left-sided pain.  Describes it as a sharp stabbing sensation going from her ribs down to her hip.  Says that it typically is fleeting but occasionally has lasted a few minutes at a time.  Not currently having severe pain.  Says that last night she did have nausea with 1 episode of nonbloody nonbilious emesis.  Denies abdominal pain otherwise.  Headache -says that initially it started several days ago as a sinus headache.  Says that it is worse on the left side of her head but slowly progressed to become bandlike.  Denies any numbness or weakness of her arms or legs.  No neck stiffness or pain.  Has been taking very aspirin as well as Tylenol and Motrin for her headache with last dose at noon.  Dysuria - says that she has been having dysuria for several days. Denies frequency or flank pain or fevers. Has hx of UTIs in the past and this feels similar.  Says she has been taking cranberry juice.    Past Medical History:  Diagnosis Date   Anemia    Asthma    seasonal   Bipolar 1 disorder ([place])    Bipolar disorder ([place])    Fibroid    Fibromyalgia    Gestational diabetes    Hx of chronic pyelonephritis    Migraine with aura    OCD (obsessive compulsive disorder)    PCOS (polycystic ovarian syndrome)    Pyelonephritis    UTI (urinary tract  infection)       Home Medications Prior to Admission medications   Medication Sig Start Date End Date Taking? Authorizing Provider  nitrofurantoin, macrocrystal-monohydrate, (MACROBID) 100 MG capsule Take 1 capsule (100 mg total) by mouth 2 (two) times daily. 10/27/21  Yes Fransico Meadow, MD  acetaminophen (TYLENOL) 325 MG tablet Take 325-650 mg by mouth every 6 (six) hours as needed (for pain/headache.).    [provider]  acetaminophen (TYLENOL) 500 MG tablet Take 1,000 mg by mouth every 6 (six) hours as needed.    [provider]  gabapentin (NEURONTIN) 600 MG tablet Take 600 mg by mouth daily. Pt decreased amt after +preg test    [provider]  lamoTRIgine (LAMICTAL) 100 MG tablet Take 100 mg by mouth daily.    [provider]  LORazepam (ATIVAN) 1 MG tablet Take 1 mg by mouth at bedtime. Changed from TID with preg    [provider]  venlafaxine XR (EFFEXOR-XR) 75 MG 24 hr capsule Take 75 mg by mouth 2 (two) times daily. 05/01/18   [provider]  zolpidem (AMBIEN) 10 MG tablet Take 10 mg by mouth at bedtime.    [provider]      Allergies    Amoxicillin, Medroxyprogesterone, Penicillins, and Latex    Review of  Systems   Review of Systems  Physical Exam Updated Vital Signs BP 97/78   Pulse 79   Temp 98.3 F (36.8 C) (Oral)   Resp (!) 22   Ht 5' 6.5" (1.689 m)   Wt (!) 137 kg   LMP 10/05/2021 (Approximate)   SpO2 98%   Breastfeeding Unknown   BMI 48.01 kg/m  Physical Exam Vitals and nursing note reviewed.  Constitutional:      General: She is not in acute distress.    Appearance: She is well-developed.  HENT:     Head: Normocephalic and atraumatic.     Comments: L frontal sinus pressure    Right Ear: External ear normal.     Left Ear: External ear normal.     Nose: Nose normal.  Eyes:     Extraocular Movements: Extraocular movements intact.     Conjunctiva/sclera: Conjunctivae normal.      Pupils: Pupils are equal, round, and reactive to light.  Cardiovascular:     Rate and Rhythm: Normal rate and regular rhythm.     Heart sounds: No murmur heard. Pulmonary:     Effort: Pulmonary effort is normal. No respiratory distress.     Breath sounds: Normal breath sounds.  Abdominal:     General: Abdomen is flat. There is no distension.     Palpations: Abdomen is soft. There is no mass.     Tenderness: There is no abdominal tenderness. There is no guarding.  Musculoskeletal:        General: No swelling.     Cervical back: Normal range of motion and neck supple.     Right lower leg: No edema.     Left lower leg: No edema.  Skin:    General: Skin is warm and dry.     Capillary Refill: Capillary refill takes less than 2 seconds.  Neurological:     Mental Status: She is alert and oriented to person, place, and time. Mental status is at baseline.     Comments: MENTAL STATUS: AAOx3 CRANIAL NERVES: II: Pupils equal and reactive 3 mm BL, no RAPD III, IV, VI: EOM intact, no gaze preference or deviation, no nystagmus. V: normal sensation to light touch in V1, V2, and V3 segments bilaterally VII: no facial weakness or asymmetry, no nasolabial fold flattening VIII: normal hearing to speech and finger friction IX, X: normal palatal elevation, no uvular deviation XI: 5/5 head turn and 5/5 shoulder shrug bilaterally XII: midline tongue protrusion MOTOR: 5/5 strength in R shoulder flexion, elbow flexion and extension, and grip strength. 5/5 strength in L shoulder flexion, elbow flexion and extension, and grip strength.  5/5 strength in R hip and knee flexion, knee extension, ankle plantar and dorsiflexion. 5/5 strength in L hip and knee flexion, knee extension, ankle plantar and dorsiflexion. SENSORY: Normal sensation to light touch in all extremities STATION: no truncal ataxia   Psychiatric:        Mood and Affect: Mood normal.     ED Results / Procedures / Treatments   Labs (all  labs ordered are listed, but only abnormal results are displayed) Labs Reviewed  URINALYSIS, ROUTINE W REFLEX MICROSCOPIC - Abnormal; Notable for the following components:      Result Value   APPearance CLOUDY (*)    Hgb urine dipstick SMALL (*)    Leukocytes,Ua MODERATE (*)    All other components within normal limits  CBC - Abnormal; Notable for the following components:   WBC 10.7 (*)  HCT 35.3 (*)    All other components within normal limits  COMPREHENSIVE METABOLIC PANEL - Abnormal; Notable for the following components:   Glucose, Bld 136 (*)    All other components within normal limits  URINALYSIS, MICROSCOPIC (REFLEX) - Abnormal; Notable for the following components:   Bacteria, UA FEW (*)    All other components within normal limits  URINE CULTURE  PREGNANCY, URINE  MAGNESIUM  LIPASE, BLOOD  TROPONIN I (HIGH SENSITIVITY)  TROPONIN I (HIGH SENSITIVITY)    EKG EKG Interpretation  Date/Time:  Tuesday October 27 2021 17:33:06 EDT Ventricular Rate:  84 PR Interval:  130 QRS Duration: 105 QT Interval:  392 QTC Calculation: 464 R Axis:   -20 Text Interpretation: Sinus rhythm Borderline left axis deviation Low voltage, precordial leads Confirmed by Margaretmary Eddy [phone]) on 10/27/2021 5:57:40 PM  Radiology No results found.  Procedures Procedures   Medications Ordered in ED Medications  diphenhydrAMINE (BENADRYL) capsule 25 mg (has no administration in time range)  ketorolac (TORADOL) 15 MG/ML injection 15 mg (15 mg Intravenous Given 10/27/21 1818)  metoCLOPramide (REGLAN) injection 10 mg (10 mg Intravenous Given 10/27/21 1818)  diphenhydrAMINE (BENADRYL) injection 25 mg (25 mg Intravenous Given 10/27/21 1818)  fluticasone (FLONASE) 50 MCG/ACT nasal spray 2 spray (2 sprays Each Nare Given 10/27/21 1818)  nitrofurantoin (macrocrystal-monohydrate) (MACROBID) capsule 100 mg (100 mg Oral Given 10/27/21 1818)    ED Course/ Medical Decision Making/ A&P                            Medical Decision Making Amount and/or Complexity of Data Reviewed Labs: ordered.  Risk Prescription drug management.   Shauniece Kwan is a 31 y.o. female with comorbidities that complicate the patient evaluation including  asthma, BP1, fibromyalgia, UTI, and migraines who presents with multiple complaints including headache, sinus pain, dysuria, and left-sided abdominal pain.  This patient presents to the ED for concern of complaints listed in HPI, this involves an extensive number of treatment options, and is a complaint that carries with it a high risk of complications and morbidity.   Initial Ddx:  Migraine, sinusitis, urinary tract infection, fibromyalgia  MDM:  Feel the patient is likely suffering from stress at her work which is exacerbating her above symptoms including her migraine and left side pain.  Has a nonfocal neurologic exam at this time and no other red flag symptoms.  Since it is only been several days and she is afebrile and overall well-appearing do not feel antibiotics are warranted for sinusitis.  We will test her for urinary tract infection.  Interpreted the patient's pain unclear what is causing her left-sided pain and she does not have any rashes or trauma.  It is both in the chest and abdomen which would be atypical for abdominal or pulmonary pathology.  Feel that it may be potentially related to her fibromyalgia but given her exam at this time have very low concern for any life-threatening process that would be causing it.  Plan:  Labs EKG Migraine cocktail Flonase Urinalysis  ED Summary:  Patient was feeling better after the above treatment.  Did report feeling mildly anxious which is likely akathisia due to the Reglan.  Received Benadryl.  Urinalysis was consistent with possible UTI so was given Macrobid after reviewing her prior urine cultures which were pansensitive E. coli.  Patient was instructed that she should follow-up with her primary  doctor in several days regarding her symptoms and  was given a work note and prescription for [employer].  Dispo: DC Home. Return precautions discussed including, but not limited to, those listed in the AVS. Allowed pt time to ask questions which were answered fully prior to dc.   Records reviewed Outpatient Clinic Notes The following labs were independently interpreted: Chemistry and Urinalysis and show urinary tract infection I personally reviewed and interpreted cardiac monitoring: normal sinus rhythm  I personally reviewed and interpreted the pt's EKG: see above for interpretation  I have reviewed the patients home medications and made adjustments as needed  Final Clinical Impression(s) / ED Diagnoses Final diagnoses:  Urinary tract infection without hematuria, site unspecified  Acute sinusitis, recurrence not specified, unspecified location  Other migraine without status migrainosus, not intractable    Rx / DC Orders ED Discharge Orders          Ordered    nitrofurantoin, macrocrystal-monohydrate, (MACROBID) 100 MG capsule  2 times daily        10/27/21 1901              Fransico Meadow, MD 10/27/21 1913

## 2021-10-27 NOTE — ED Triage Notes (Signed)
Pt arrives and states "I'm coming in to address all of the health problems I've been ignoring"  c/o intermittent migraine headaches, pressure in lower left chest, sinus infection, uti sx, dizziness, heartburn, left sided jaw pain, numbness around shoulder blades, thigh/hip cramps x months.

## 2021-10-29 LAB — URINE CULTURE: Culture: 20000 — AB

## 2021-10-30 ENCOUNTER — Telehealth (HOSPITAL_BASED_OUTPATIENT_CLINIC_OR_DEPARTMENT_OTHER): Payer: Self-pay | Admitting: *Deleted

## 2021-10-30 NOTE — Telephone Encounter (Signed)
Post ED Visit - Positive Culture Follow-up: Unsuccessful Patient Follow-up  Culture assessed and recommendations reviewed by:  '[]'$  Elenor Quinones, Pharm.D. '[]'$  Heide Guile, Pharm.D., BCPS AQ-ID '[]'$  Parks Neptune, Pharm.D., BCPS '[]'$  Alycia Rossetti, Pharm.D., BCPS '[]'$  [place], Pharm.D., BCPS, AAHIVP '[]'$  Legrand Como, Pharm.D., BCPS, AAHIVP '[]'$  Wynell Balloon, PharmD '[]'$  Vincenza Hews, PharmD, BCPS  Positive urine culture  '[]'$  Patient discharged without antimicrobial prescription and treatment is now indicated '[x]'$  Organism is resistant to prescribed ED discharge antimicrobial '[]'$  Patient with positive blood cultures  Plan:  Stop Macrobid.  Start Keflex '500mg'$  PO BID x 5 days, Cherlynn June, [place]  Unable to contact patient after 3 attempts, letter will be sent to address on file  Ardeen Fillers 10/30/2021, 10:33 AM

## 2021-10-30 NOTE — Progress Notes (Signed)
ED Antimicrobial Stewardship Positive Culture Follow Up   Kathleen Sanders is an 31 y.o. female who presented to [facility] on 10/27/2021 with a chief complaint of  Chief Complaint  Patient presents with   Multiple Complaints    Recent Results (from the past 720 hour(s))  Urine Culture     Status: Abnormal   Collection Time: 10/27/21  6:05 PM   Specimen: Urine, Clean Catch  Result Value Ref Range Status   Specimen Description   Final    URINE, CLEAN CATCH Performed at [facility], [address] [place], [place] [address]    Special Requests   Final    NONE Performed at [facility], [address] [place], [place] [address]    Culture (A)  Final    20,000 COLONIES/mL GROUP B STREP(S.AGALACTIAE)ISOLATED TESTING AGAINST S. AGALACTIAE NOT ROUTINELY PERFORMED DUE TO PREDICTABILITY OF AMP/PEN/VAN SUSCEPTIBILITY. Performed at [facility], [place] [address] [place], [place] [address]    Report Status 10/29/2021 FINAL  Final    '[x]'$  Treated with Macrobid, organism resistant to prescribed antimicrobial  New antibiotic prescription: Stop Marcobid, start Keflex '500mg'$  BID for 5 days.   ED Provider: Regis Bill, PA-C    [name] 10/30/2021, 10:03 AM Clinical Pharmacist Monday - Friday phone -  [phone] Saturday - Sunday phone - [phone]

## 2021-11-07 ENCOUNTER — Telehealth (HOSPITAL_BASED_OUTPATIENT_CLINIC_OR_DEPARTMENT_OTHER): Payer: Self-pay

## 2021-11-07 NOTE — Telephone Encounter (Signed)
Post ED Visit - Positive Culture Follow-up: Successful Patient Follow-Up  Culture assessed and recommendations reviewed by:  '[x]'$  Esmeralda Arthur, Pharm.D. '[]'$  Heide Guile, Pharm.D., BCPS AQ-ID '[]'$  Parks Neptune, Pharm.D., BCPS '[]'$  Alycia Rossetti, Pharm.D., BCPS '[]'$  [place], Pharm.D., BCPS, AAHIVP '[]'$  Legrand Como, Pharm.D., BCPS, AAHIVP '[]'$  Salome Arnt, PharmD, BCPS '[]'$  Johnnette Gourd, PharmD, BCPS '[]'$  Hughes Better, PharmD, BCPS '[]'$  Leeroy Cha, PharmD  Positive urine culture  '[]'$  Patient discharged without antimicrobial prescription and treatment is now indicated '[x]'$  Organism is resistant to prescribed ED discharge antimicrobial '[]'$  Patient with positive blood cultures  Changes discussed with ED provider: Cherlynn June, PA New antibiotic prescription Keflex 500 mg po BID x 5 days  Called to [place] patient, date 11/07/21, time 12:30   Glennon Hamilton 11/07/2021, 12:45 PM

## 2021-12-04 ENCOUNTER — Telehealth: Payer: Self-pay | Admitting: *Deleted

## 2021-12-04 NOTE — Telephone Encounter (Signed)
Patient called the office stating she had received a voicemail asking her to call and reschedule her appointment. Patient advised I would send the message to front office staff an have them reach out Monday.

## 2021-12-07 NOTE — Telephone Encounter (Signed)
LMOM returning patient's call.

## 2021-12-18 ENCOUNTER — Encounter: Payer: Self-pay | Admitting: Family Medicine

## 2021-12-21 ENCOUNTER — Other Ambulatory Visit: Payer: Self-pay | Admitting: Family Medicine

## 2021-12-21 DIAGNOSIS — R202 Paresthesia of skin: Secondary | ICD-10-CM

## 2021-12-21 DIAGNOSIS — G4452 New daily persistent headache (NDPH): Secondary | ICD-10-CM

## 2021-12-21 DIAGNOSIS — R251 Tremor, unspecified: Secondary | ICD-10-CM

## 2022-01-19 ENCOUNTER — Ambulatory Visit
Admission: RE | Admit: 2022-01-19 | Discharge: 2022-01-19 | Disposition: A | Discharge status: Home / Self Care | Payer: 59 | Source: Ambulatory Visit | Attending: Family Medicine | Admitting: Family Medicine

## 2022-01-19 DIAGNOSIS — R251 Tremor, unspecified: Secondary | ICD-10-CM

## 2022-01-19 DIAGNOSIS — G4452 New daily persistent headache (NDPH): Secondary | ICD-10-CM

## 2022-01-19 DIAGNOSIS — R202 Paresthesia of skin: Secondary | ICD-10-CM

## 2022-01-19 MED ORDER — GADOPICLENOL 0.5 MMOL/ML IV SOLN
10.0000 mL | Freq: Once | INTRAVENOUS | Status: AC | PRN
  Administered 2022-01-19: 10 mL via INTRAVENOUS

## 2022-02-03 ENCOUNTER — Ambulatory Visit: Payer: 59 | Admitting: Diagnostic Neuroimaging

## 2022-02-03 ENCOUNTER — Encounter: Payer: Self-pay | Admitting: Diagnostic Neuroimaging

## 2022-02-03 VITALS — BP 137/87 | HR 77 | Ht 66.0 in | Wt 310.0 lb

## 2022-02-03 DIAGNOSIS — F419 Anxiety disorder, unspecified: Secondary | ICD-10-CM | POA: Diagnosis not present

## 2022-02-03 DIAGNOSIS — R519 Headache, unspecified: Secondary | ICD-10-CM | POA: Diagnosis not present

## 2022-02-03 DIAGNOSIS — G4733 Obstructive sleep apnea (adult) (pediatric): Secondary | ICD-10-CM | POA: Diagnosis not present

## 2022-02-03 DIAGNOSIS — R251 Tremor, unspecified: Secondary | ICD-10-CM
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
## 2022-02-03 NOTE — Patient Instructions (Signed)
TREMORS - could be enhanced physiologic tremor, anxiety associated tremor; essential tremor (possible but less likely) - consider MRI cervical spine (will ask ortho clinic to setup, when she has MRI lumbar spine)  WAKE UP DAILY HEADACHES (likely related to untreated OSA) - follow up CPAP treatment

## 2022-02-13 ENCOUNTER — Emergency Department (HOSPITAL_BASED_OUTPATIENT_CLINIC_OR_DEPARTMENT_OTHER): Payer: 59

## 2022-02-13 ENCOUNTER — Other Ambulatory Visit: Payer: Self-pay

## 2022-02-13 ENCOUNTER — Emergency Department (HOSPITAL_BASED_OUTPATIENT_CLINIC_OR_DEPARTMENT_OTHER)
Admission: EM | Admit: 2022-02-13 | Discharge: 2022-02-13 | Discharge status: Against Medical Advice | Payer: 59 | Attending: Emergency Medicine | Admitting: Emergency Medicine

## 2022-02-13 ENCOUNTER — Encounter (HOSPITAL_BASED_OUTPATIENT_CLINIC_OR_DEPARTMENT_OTHER): Payer: Self-pay | Admitting: Emergency Medicine

## 2022-02-13 DIAGNOSIS — R071 Chest pain on breathing: Secondary | ICD-10-CM | POA: Insufficient documentation

## 2022-02-13 DIAGNOSIS — R1011 Right upper quadrant pain: Secondary | ICD-10-CM | POA: Insufficient documentation

## 2022-02-13 DIAGNOSIS — Z5321 Procedure and treatment not carried out due to patient leaving prior to being seen by health care provider: Secondary | ICD-10-CM | POA: Diagnosis not present

## 2022-02-13 DIAGNOSIS — R110 Nausea: Secondary | ICD-10-CM | POA: Diagnosis not present

## 2022-02-13 DIAGNOSIS — R197 Diarrhea, unspecified: Secondary | ICD-10-CM | POA: Insufficient documentation

## 2022-02-13 LAB — CBC WITH DIFFERENTIAL/PLATELET
Abs Immature Granulocytes: 0.02 10*3/uL (ref 0.00–0.07)
Basophils Absolute: 0 10*3/uL (ref 0.0–0.1)
Basophils Relative: 1 %
Eosinophils Absolute: 0.3 10*3/uL (ref 0.0–0.5)
Eosinophils Relative: 3 %
HCT: 37 % (ref 36.0–46.0)
Hemoglobin: 12.5 g/dL (ref 12.0–15.0)
Immature Granulocytes: 0 %
Lymphocytes Relative: 27 %
Lymphs Abs: 2.4 10*3/uL (ref 0.7–4.0)
MCH: 30.3 pg (ref 26.0–34.0)
MCHC: 33.8 g/dL (ref 30.0–36.0)
MCV: 89.6 fL (ref 80.0–100.0)
Monocytes Absolute: 0.4 10*3/uL (ref 0.1–1.0)
Monocytes Relative: 5 %
Neutro Abs: 5.6 10*3/uL (ref 1.7–7.7)
Neutrophils Relative %: 64 %
Platelets: 247 10*3/uL (ref 150–400)
RBC: 4.13 MIL/uL (ref 3.87–5.11)
RDW: 13.3 % (ref 11.5–15.5)
WBC: 8.7 10*3/uL (ref 4.0–10.5)
nRBC: 0 % (ref 0.0–0.2)

## 2022-02-13 LAB — COMPREHENSIVE METABOLIC PANEL
ALT: 18 U/L (ref 0–44)
AST: 16 U/L (ref 15–41)
Albumin: 4.6 g/dL (ref 3.5–5.0)
Alkaline Phosphatase: 51 U/L (ref 38–126)
Anion gap: 10 (ref 5–15)
BUN: 12 mg/dL (ref 6–20)
CO2: 26 mmol/L (ref 22–32)
Calcium: 9.8 mg/dL (ref 8.9–10.3)
Chloride: 103 mmol/L (ref 98–111)
Creatinine, Ser: 0.85 mg/dL (ref 0.44–1.00)
GFR, Estimated: >60 mL/min (ref >60)
Glucose, Bld: 112 mg/dL — ABNORMAL HIGH (ref 70–99)
Potassium: 3.8 mmol/L (ref 3.5–5.1)
Sodium: 139 mmol/L (ref 135–145)
Total Bilirubin: 0.4 mg/dL (ref 0.3–1.2)
Total Protein: 7.5 g/dL (ref 6.5–8.1)

## 2022-02-13 LAB — URINALYSIS, ROUTINE W REFLEX MICROSCOPIC
Bilirubin Urine: NEGATIVE
Glucose, UA: NEGATIVE mg/dL
Hgb urine dipstick: NEGATIVE
Ketones, ur: NEGATIVE mg/dL
Leukocytes,Ua: NEGATIVE
Nitrite: NEGATIVE
Protein, ur: NEGATIVE mg/dL
Specific Gravity, Urine: 1.006 (ref 1.005–1.030)
pH: 6 (ref 5.0–8.0)

## 2022-02-13 LAB — PREGNANCY, URINE: Preg Test, Ur: NEGATIVE

## 2022-02-13 LAB — LIPASE, BLOOD: Lipase: 45 U/L (ref 11–51)

## 2022-02-13 NOTE — ED Triage Notes (Signed)
Right flank pain started few days ago, when breathe deep hurts from under sternum to right side. Underneath ribs,feels restricted when takes deep breath.

## 2022-02-13 NOTE — ED Provider Triage Note (Signed)
Emergency Medicine Provider Triage Evaluation Note  Kathleen Sanders , a 32 y.o. female  was evaluated in triage.  Pt complains of abd pain. Pain to RUQ radiates down to R side abdomen x 2-3 days.  Hurts to breath, some nausea and some loose stool.  No fever, cough, sob, dysuria.  On menstruation.   Review of Systems  Positive: As above Negative: As above  Physical Exam  BP 128/72 (BP Location: Right Arm)   Pulse 72   Temp 98.5 F (36.9 C) (Oral)   Resp 16   LMP 05/21/2021 (Approximate) Comment: miscarriage july  SpO2 100%  Gen:   Awake, no distress   Resp:  Normal effort  MSK:   Moves extremities without difficulty  Other:    Medical Decision Making  Medically screening exam initiated at 3:12 PM.  Appropriate orders placed.  Kathleen Sanders was informed that the remainder of the evaluation will be completed by another provider, this initial triage assessment does not replace that evaluation, and the importance of remaining in the ED until their evaluation is complete.     Domenic Moras, PA-C 02/13/22 1514

## 2022-02-24 ENCOUNTER — Ambulatory Visit: Payer: 59 | Attending: Internal Medicine | Admitting: Internal Medicine

## 2022-02-24 ENCOUNTER — Encounter: Payer: Self-pay | Admitting: Internal Medicine

## 2022-02-24 VITALS — BP 121/81 | HR 76 | Resp 16 | Ht 66.0 in | Wt 307.0 lb

## 2022-02-24 DIAGNOSIS — R768 Other specified abnormal immunological findings in serum: Secondary | ICD-10-CM | POA: Insufficient documentation

## 2022-02-24 DIAGNOSIS — M2550 Pain in unspecified joint: Secondary | ICD-10-CM | POA: Insufficient documentation

## 2022-02-24 DIAGNOSIS — M549 Dorsalgia, unspecified: Secondary | ICD-10-CM

## 2022-02-24 DIAGNOSIS — M797 Fibromyalgia: Secondary | ICD-10-CM | POA: Diagnosis not present

## 2022-02-24 DIAGNOSIS — G8929 Other chronic pain: Secondary | ICD-10-CM
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
## 2022-02-25 LAB — ANA: Anti Nuclear Antibody (ANA): NEGATIVE

## 2022-02-25 LAB — C-REACTIVE PROTEIN: CRP: 7 mg/L (ref <8.0)

## 2022-02-25 LAB — C3 AND C4
C3 Complement: 162 mg/dL (ref 83–193)
C4 Complement: 29 mg/dL (ref 15–57)

## 2022-02-25 LAB — RHEUMATOID FACTOR: Rheumatoid fact SerPl-aCnc: 21 IU/mL — ABNORMAL HIGH (ref <14)

## 2022-02-25 LAB — SEDIMENTATION RATE: Sed Rate: 25 mm/h — ABNORMAL HIGH (ref 0–20)

## 2022-03-28 IMAGING — CT CT RENAL STONE PROTOCOL
2 of 4 series · 16 of 46 positions shown, 18 images · non-contrast
Comparison: July 06, 2018

CLINICAL DATA: Left flank pain

EXAM:
CT ABDOMEN AND PELVIS WITHOUT CONTRAST
TECHNIQUE: Multidetector CT imaging of the abdomen and pelvis was performed
following the standard protocol without oral or IV contrast.

[Series 2: stone full · axial · 0.95mm/px · z∈[+878,+1313]mm · 13 of 95 slices shown, 15 images]
[im 4/95  soft-tissue]
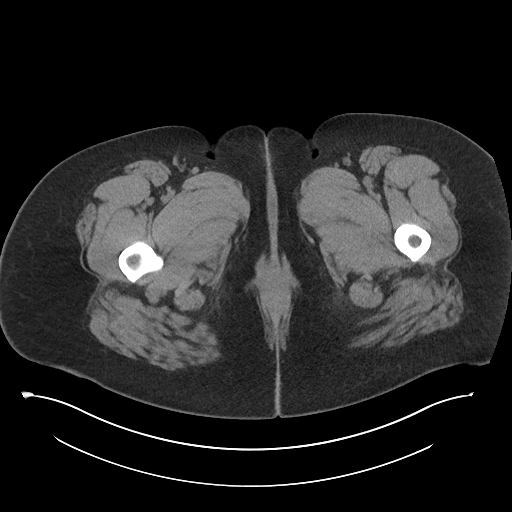
[im 4/95  bone]
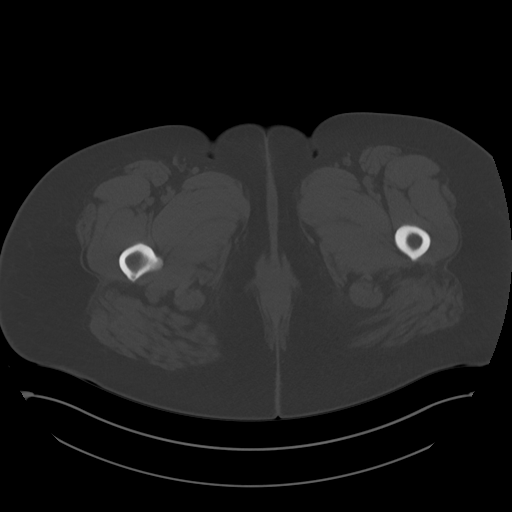
[im 12/95  soft-tissue]
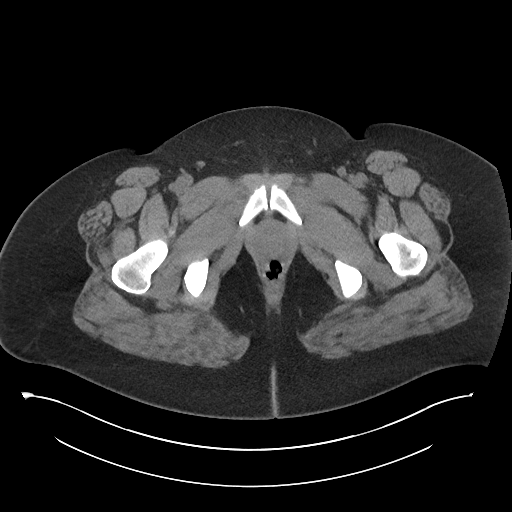
[im 19/95  soft-tissue]
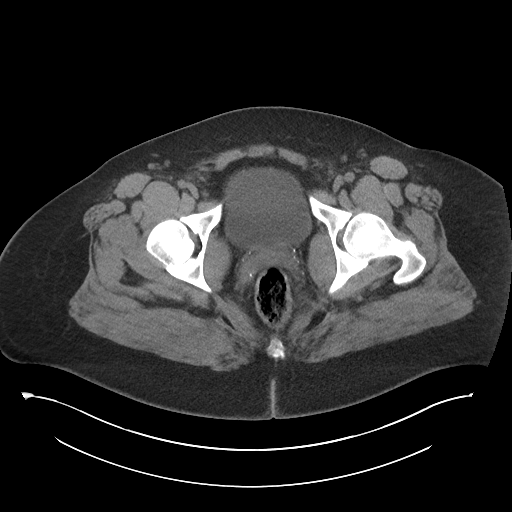
[im 27/95  soft-tissue]
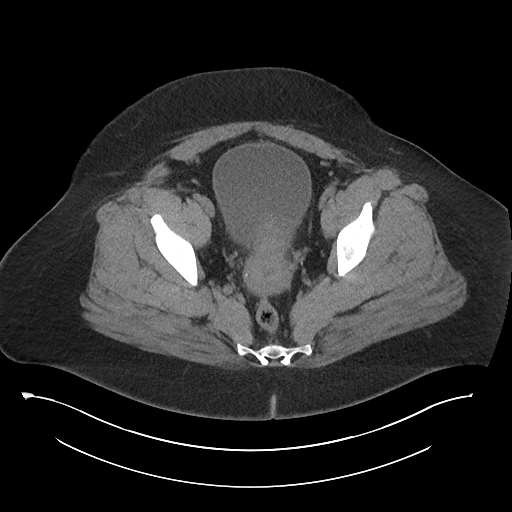
[im 34/95  soft-tissue]
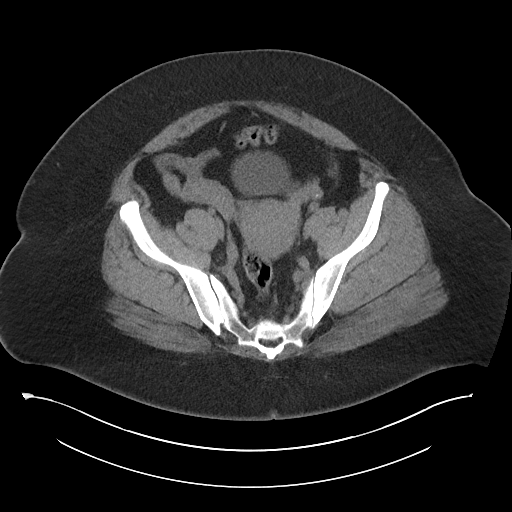
[im 42/95  soft-tissue]
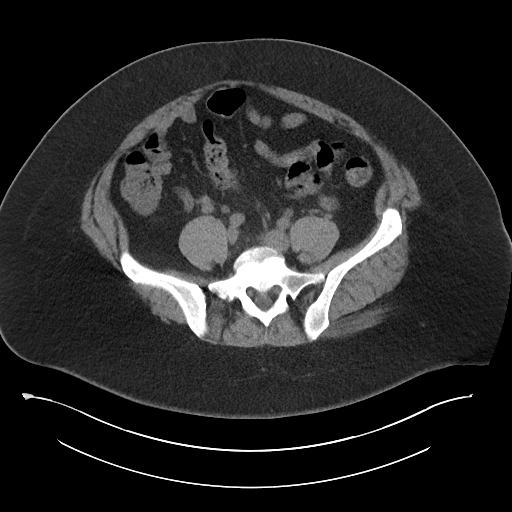
[im 49/95  soft-tissue]
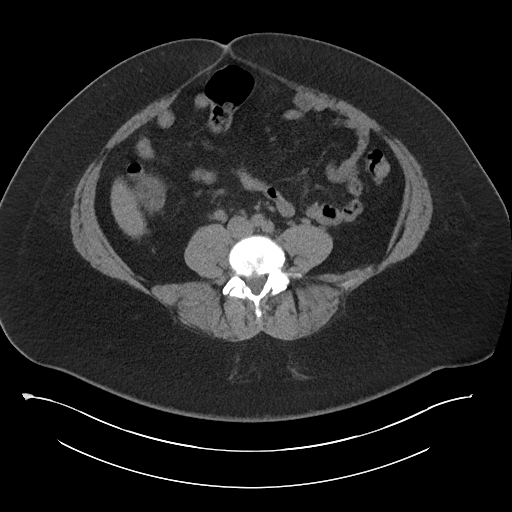
[im 53/95  soft-tissue]
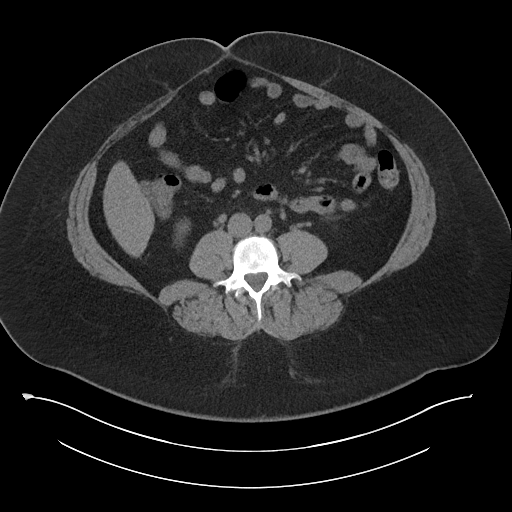
[im 61/95  soft-tissue]
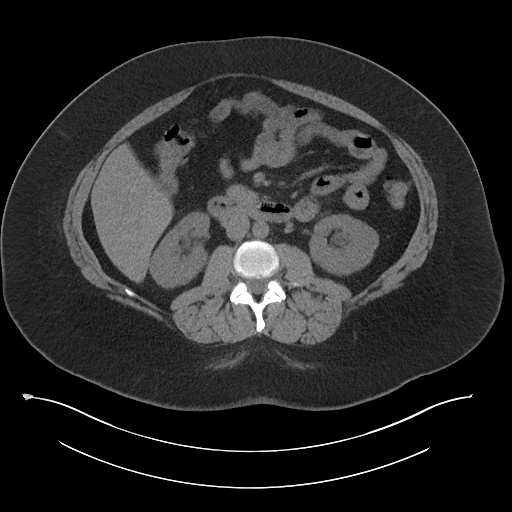
[im 61/95  bone]
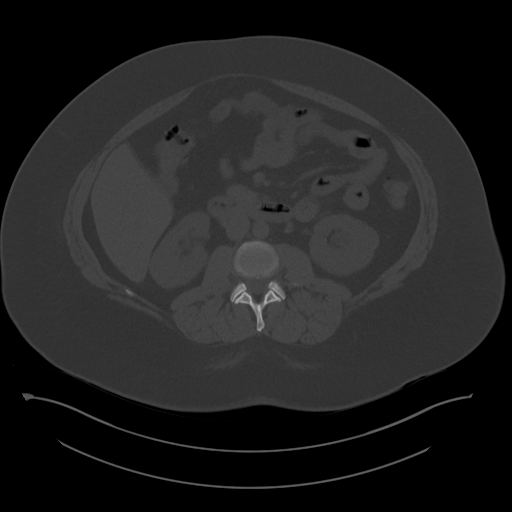
[im 68/95  soft-tissue]
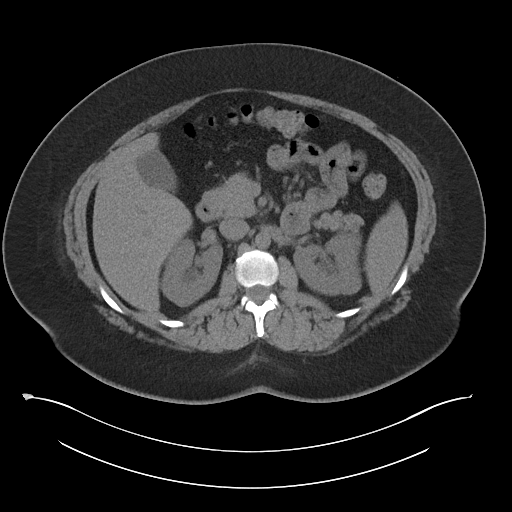
[im 76/95  soft-tissue]
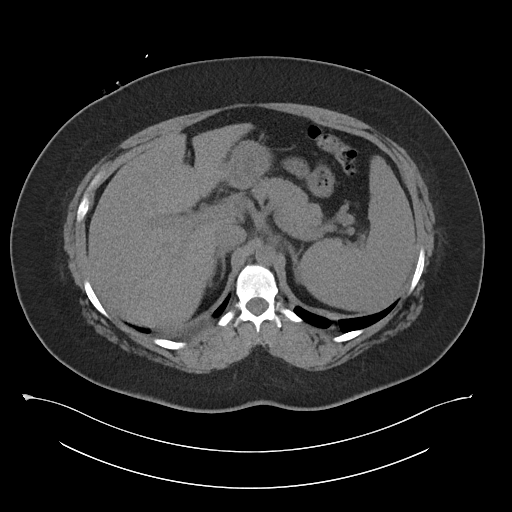
[im 83/95  soft-tissue]
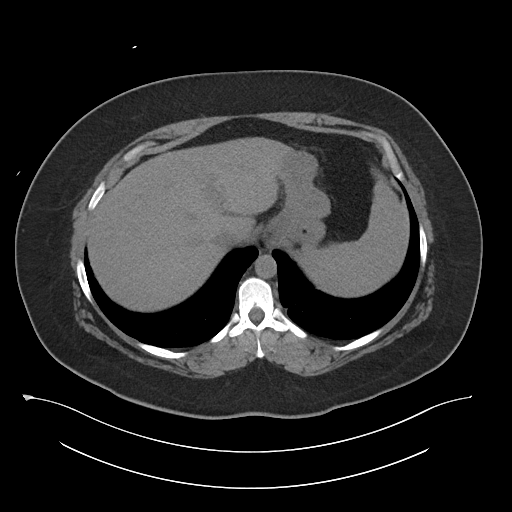
[im 91/95  soft-tissue]
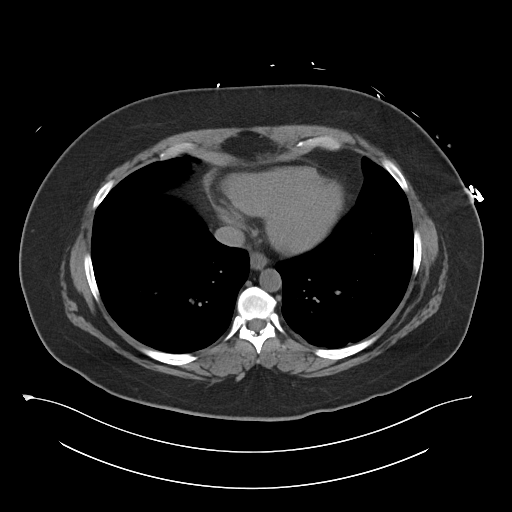

[Series 5: coronal · coronal · 0.97mm/px · 3 of 128 slices shown]
[im 43/128  soft-tissue]
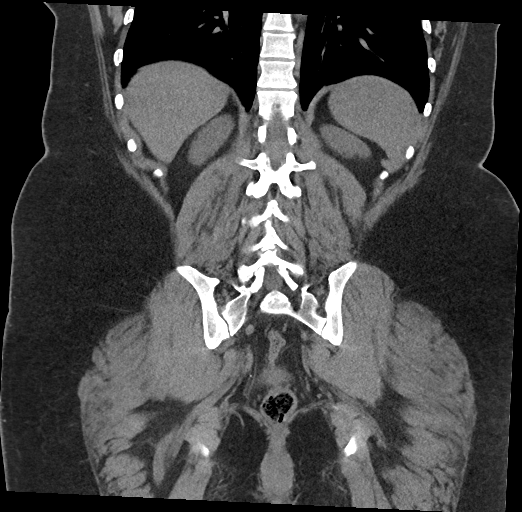
[im 57/128  soft-tissue]
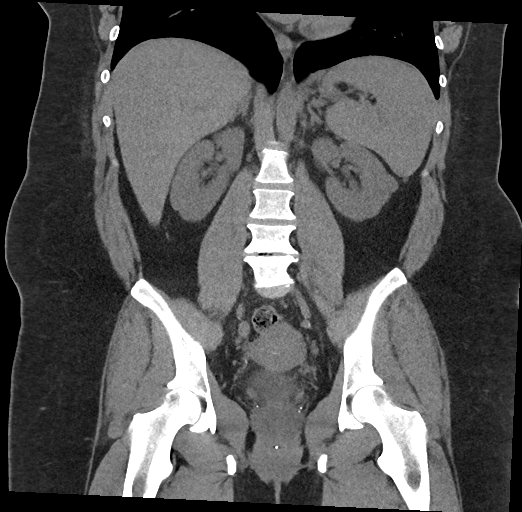
[im 71/128  soft-tissue]
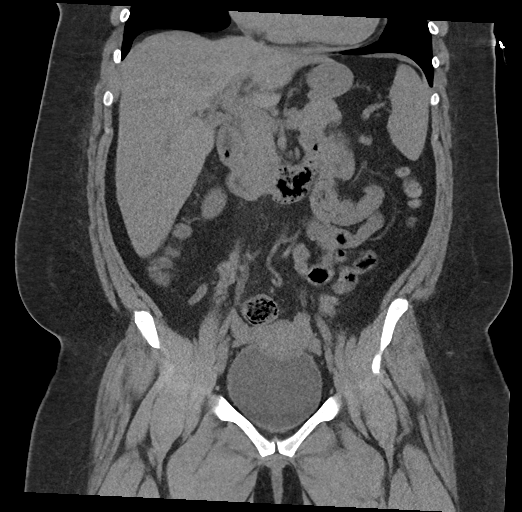

[16 of 46 positions shown; findings below may reference images not displayed]

FINDINGS: Lower chest: There is bibasilar atelectasis. Lung bases otherwise
are clear.

Hepatobiliary: No focal liver lesions are appreciable on this
noncontrast enhanced study. Gallbladder wall is not appreciably
thickened. There is no biliary duct dilatation.

Pancreas: There is no pancreatic mass or inflammatory focus.

Spleen: No splenic lesions are appreciable.

Adrenals/Urinary Tract: Adrenals bilaterally appear normal. There is
no appreciable renal mass or hydronephrosis on either side. There is
no appreciable renal or ureteral calculus on either side. Urinary
bladder is midline with wall thickness within normal limits. Several
phleboliths are noted in the pelvis, separate from the distal
ureters.

Stomach/Bowel: There is no appreciable bowel wall or mesenteric
thickening. There is no appreciable bowel obstruction. The terminal
ileum appears normal. Appendix appears normal. No evident free air
or portal venous air.

Vascular/Lymphatic: No abdominal aortic aneurysm. No vascular
lesions evident on noncontrast enhanced study. No evident adenopathy
in the abdomen or pelvis.

Reproductive: Uterus is anteverted. There is an intrauterine device
positioned within the endometrium. No appreciable adnexal masses.

Other: No abscess or ascites evident in the abdomen or pelvis. There
is thinning of the rectus muscle in the midline abdomen region.

Musculoskeletal: There is degenerative change in the lumbar spine.
No blastic or lytic bone lesions. Small bone island in the left
intertrochanteric femur region, stable. No abdominal wall or
intramuscular lesions.
IMPRESSION: 1. No evident renal or ureteral calculus. No hydronephrosis on
either side. Urinary bladder wall thickness normal.

2. No bowel wall thickening or bowel obstruction. No abscess in the
abdomen or pelvis. Appendix appears normal.

3.  Intrauterine device positioned within the endometrium.
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
## 2022-03-29 ENCOUNTER — Encounter: Payer: Self-pay | Admitting: Internal Medicine

## 2022-03-29 ENCOUNTER — Ambulatory Visit: Payer: 59 | Attending: Internal Medicine | Admitting: Internal Medicine

## 2022-03-29 VITALS — BP 130/78 | HR 80 | Resp 14 | Ht 66.0 in | Wt 303.0 lb

## 2022-03-29 DIAGNOSIS — R768 Other specified abnormal immunological findings in serum: Secondary | ICD-10-CM

## 2022-03-29 DIAGNOSIS — M2550 Pain in unspecified joint: Secondary | ICD-10-CM

## 2022-03-29 DIAGNOSIS — M797 Fibromyalgia: Secondary | ICD-10-CM | POA: Diagnosis not present

## 2022-03-29 MED ORDER — HYDROXYCHLOROQUINE SULFATE 200 MG PO TABS: 400.0000 mg | ORAL_TABLET | Freq: Every day | ORAL | 1 refills | Status: DC

## 2022-03-29 NOTE — Patient Instructions (Signed)
Hydroxychloroquine Tablets What is this medication? HYDROXYCHLOROQUINE (hye drox ee KLOR oh kwin) treats autoimmune conditions, such as rheumatoid arthritis and lupus. It works by slowing down an overactive immune system. It may also be used to prevent and treat malaria. It works by killing the parasite that causes malaria. It belongs to a group of medications called DMARDs. This medicine may be used for other purposes; ask your health care provider or pharmacist if you have questions. COMMON BRAND NAME(S): Plaquenil, Quineprox What should I tell my care team before I take this medication? They need to know if you have any of these conditions: Diabetes Eye disease, vision problems Frequently drink alcohol G6PD deficiency Heart disease Irregular heartbeat or rhythm Kidney disease Liver disease Porphyria Psoriasis An unusual or allergic reaction to hydroxychloroquine, other medications, foods, dyes, or preservatives Pregnant or trying to get pregnant Breastfeeding How should I use this medication? Take this medication by mouth with water. Take it as directed on the prescription label. Do not cut, crush, or chew this medication. Swallow the tablets whole. Take it with food. Do not take it more than directed. Take all of this medication unless your care team tells you to stop it early. Keep taking it even if you think you are better. Take products with antacids in them at a different time of day than this medication. Take this medication 4 hours before or 4 hours after antacids. Talk to your care team if you have questions. Talk to your care team about the use of this medication in children. While this medication may be prescribed for selected conditions, precautions do apply. Overdosage: If you think you have taken too much of this medicine contact a poison control center or emergency room at once. NOTE: This medicine is only for you. Do not share this medicine with others. What if I miss a  dose? If you miss a dose, take it as soon as you can. If it is almost time for your next dose, take only that dose. Do not take double or extra doses. What may interact with this medication? Do not take this medication with any of the following: Cisapride Dronedarone Pimozide Thioridazine This medication may also interact with the following: Ampicillin Antacids Cimetidine Cyclosporine Digoxin Kaolin Medications for diabetes, such as insulin, glipizide, glyburide Medications for seizures, such as carbamazepine, phenobarbital, phenytoin Mefloquine Methotrexate Other medications that cause heart rhythm changes Praziquantel This list may not describe all possible interactions. Give your health care provider a list of all the medicines, herbs, non-prescription drugs, or dietary supplements you use. Also tell them if you smoke, drink alcohol, or use illegal drugs. Some items may interact with your medicine. What should I watch for while using this medication? Visit your care team for regular checks on your progress. Tell your care team if your symptoms do not start to get better or if they get worse. You may need blood work done while you are taking this medication. If you take other medications that can affect heart rhythm, you may need more testing. Talk to your care team if you have questions. Your vision may be tested before and during use of this medication. Tell your care team right away if you have any change in your eyesight. This medication may cause serious skin reactions. They can happen weeks to months after starting the medication. Contact your care team right away if you notice fevers or flu-like symptoms with a rash. The rash may be red or purple and then turn   into blisters or peeling of the skin. Or, you might notice a red rash with swelling of the face, lips or lymph nodes in your neck or under your arms. If you or your family notice any changes in your behavior, such as new or  worsening depression, thoughts of harming yourself, anxiety, or other unusual or disturbing thoughts, or memory loss, call your care team right away. What side effects may I notice from receiving this medication? Side effects that you should report to your care team as soon as possible: Allergic reactions--skin rash, itching, hives, swelling of the face, lips, tongue, or throat Aplastic anemia--unusual weakness or fatigue, dizziness, headache, trouble breathing, increased bleeding or bruising Change in vision Heart rhythm changes--fast or irregular heartbeat, dizziness, feeling faint or lightheaded, chest pain, trouble breathing Infection--fever, chills, cough, or sore throat Low blood sugar (hypoglycemia)--tremors or shaking, anxiety, sweating, cold or clammy skin, confusion, dizziness, rapid heartbeat Muscle injury--unusual weakness or fatigue, muscle pain, dark yellow or brown urine, decrease in amount of urine Pain, tingling, or numbness in the hands or feet Rash, fever, and swollen lymph nodes Redness, blistering, peeling, or loosening of the skin, including inside the mouth Thoughts of suicide or self-harm, worsening mood, or feelings of depression Unusual bruising or bleeding Side effects that usually do not require medical attention (report to your care team if they continue or are bothersome): Diarrhea Headache Nausea Stomach pain Vomiting This list may not describe all possible side effects. Call your doctor for medical advice about side effects. You may report side effects to FDA at 1-800-FDA-1088. Where should I keep my medication? Keep out of the reach of children and pets. Store at room temperature up to 30 degrees C (86 degrees F). Protect from light. Get rid of any unused medication after the expiration date. To get rid of medications that are no longer needed or have expired: Take the medication to a medication take-back program. Check with your pharmacy or law enforcement  to find a location. If you cannot return the medication, check the label or package insert to see if the medication should be thrown out in the garbage or flushed down the toilet. If you are not sure, ask your care team. If it is safe to put it in the trash, empty the medication out of the container. Mix the medication with cat litter, dirt, coffee grounds, or other unwanted substance. Seal the mixture in a bag or container. Put it in the trash. NOTE: This sheet is a summary. It may not cover all possible information. If you have questions about this medicine, talk to your doctor, pharmacist, or health care provider.  2023 Elsevier/Gold Standard (2007-02-25 00:00:00)  
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
## 2022-06-02 ENCOUNTER — Ambulatory Visit: Payer: 59 | Attending: Internal Medicine | Admitting: Internal Medicine

## 2022-06-02 ENCOUNTER — Encounter: Payer: Self-pay | Admitting: Internal Medicine

## 2022-06-02 VITALS — BP 119/73 | HR 83 | Resp 14 | Ht 66.0 in | Wt 289.0 lb

## 2022-06-02 DIAGNOSIS — M2550 Pain in unspecified joint: Secondary | ICD-10-CM | POA: Diagnosis not present

## 2022-06-02 DIAGNOSIS — Z79899 Other long term (current) drug therapy: Secondary | ICD-10-CM

## 2022-06-02 DIAGNOSIS — R768 Other specified abnormal immunological findings in serum: Secondary | ICD-10-CM | POA: Diagnosis not present

## 2022-06-02 DIAGNOSIS — M797 Fibromyalgia: Secondary | ICD-10-CM | POA: Diagnosis not present

## 2022-06-02 MED ORDER — HYDROXYCHLOROQUINE SULFATE 200 MG PO TABS: 200.0000 mg | ORAL_TABLET | Freq: Every day | ORAL | 1 refills | Status: DC

## 2022-06-03 LAB — BASIC METABOLIC PANEL WITH GFR
BUN: 12 mg/dL (ref 7–25)
CO2: 23 mmol/L (ref 20–32)
Calcium: 8.8 mg/dL (ref 8.6–10.2)
Chloride: 106 mmol/L (ref 98–110)
Creat: 0.85 mg/dL (ref 0.50–0.97)
Glucose, Bld: 113 mg/dL — ABNORMAL HIGH (ref 65–99)
Potassium: 3.8 mmol/L (ref 3.5–5.3)
Sodium: 139 mmol/L (ref 135–146)
eGFR: 92 mL/min/{1.73_m2} (ref >=60)

## 2022-06-03 LAB — CBC WITH DIFFERENTIAL/PLATELET
Absolute Monocytes: 568 cells/uL (ref 200–950)
Basophils Absolute: 39 cells/uL (ref 0–200)
Basophils Relative: 0.4 %
Eosinophils Absolute: 245 cells/uL (ref 15–500)
Eosinophils Relative: 2.5 %
HCT: 34.5 % — ABNORMAL LOW (ref 35.0–45.0)
Hemoglobin: 11.7 g/dL (ref 11.7–15.5)
Lymphs Abs: 2136 cells/uL (ref 850–3900)
MCH: 31.5 pg (ref 27.0–33.0)
MCHC: 33.9 g/dL (ref 32.0–36.0)
MCV: 92.7 fL (ref 80.0–100.0)
MPV: 11.2 fL (ref 7.5–12.5)
Monocytes Relative: 5.8 %
Neutro Abs: 6811 cells/uL (ref 1500–7800)
Neutrophils Relative %: 69.5 %
Platelets: 245 10*3/uL (ref 140–400)
RBC: 3.72 10*6/uL — ABNORMAL LOW (ref 3.80–5.10)
RDW: 14 % (ref 11.0–15.0)
Total Lymphocyte: 21.8 %
WBC: 9.8 10*3/uL (ref 3.8–10.8)

## 2022-06-03 LAB — SEDIMENTATION RATE: Sed Rate: 33 mm/h — ABNORMAL HIGH (ref 0–20)

## 2022-09-06 ENCOUNTER — Encounter: Payer: Self-pay | Admitting: Internal Medicine

## 2022-09-07 ENCOUNTER — Other Ambulatory Visit: Payer: Self-pay

## 2022-09-07 ENCOUNTER — Encounter (HOSPITAL_BASED_OUTPATIENT_CLINIC_OR_DEPARTMENT_OTHER): Payer: Self-pay | Admitting: Emergency Medicine

## 2022-09-07 ENCOUNTER — Emergency Department (HOSPITAL_BASED_OUTPATIENT_CLINIC_OR_DEPARTMENT_OTHER): Payer: Medicaid Other

## 2022-09-07 ENCOUNTER — Emergency Department (HOSPITAL_BASED_OUTPATIENT_CLINIC_OR_DEPARTMENT_OTHER)
Admission: EM | Admit: 2022-09-07 | Discharge: 2022-09-07 | Disposition: A | Discharge status: Home / Self Care | Payer: Medicaid Other | Attending: Emergency Medicine | Admitting: Emergency Medicine

## 2022-09-07 ENCOUNTER — Other Ambulatory Visit (HOSPITAL_BASED_OUTPATIENT_CLINIC_OR_DEPARTMENT_OTHER): Payer: Self-pay

## 2022-09-07 DIAGNOSIS — Z91040 Latex allergy status: Secondary | ICD-10-CM | POA: Insufficient documentation

## 2022-09-07 DIAGNOSIS — R519 Headache, unspecified: Secondary | ICD-10-CM | POA: Insufficient documentation

## 2022-09-07 DIAGNOSIS — R42 Dizziness and giddiness: Secondary | ICD-10-CM | POA: Insufficient documentation

## 2022-09-07 LAB — COMPREHENSIVE METABOLIC PANEL
ALT: 17 U/L (ref 0–44)
AST: 23 U/L (ref 15–41)
Albumin: 4.1 g/dL (ref 3.5–5.0)
Alkaline Phosphatase: 50 U/L (ref 38–126)
Anion gap: 7 (ref 5–15)
BUN: 11 mg/dL (ref 6–20)
CO2: 28 mmol/L (ref 22–32)
Calcium: 9.3 mg/dL (ref 8.9–10.3)
Chloride: 105 mmol/L (ref 98–111)
Creatinine, Ser: 0.82 mg/dL (ref 0.44–1.00)
GFR, Estimated: >60 mL/min (ref >60)
Glucose, Bld: 87 mg/dL (ref 70–99)
Potassium: 4.5 mmol/L (ref 3.5–5.1)
Sodium: 140 mmol/L (ref 135–145)
Total Bilirubin: 0.4 mg/dL (ref 0.3–1.2)
Total Protein: 6.5 g/dL (ref 6.5–8.1)

## 2022-09-07 LAB — CBC WITH DIFFERENTIAL/PLATELET
Abs Immature Granulocytes: 0.04 10*3/uL (ref 0.00–0.07)
Basophils Absolute: 0.1 10*3/uL (ref 0.0–0.1)
Basophils Relative: 1 %
Eosinophils Absolute: 0.2 10*3/uL (ref 0.0–0.5)
Eosinophils Relative: 3 %
HCT: 34.4 % — ABNORMAL LOW (ref 36.0–46.0)
Hemoglobin: 11.7 g/dL — ABNORMAL LOW (ref 12.0–15.0)
Immature Granulocytes: 1 %
Lymphocytes Relative: 23 %
Lymphs Abs: 1.6 10*3/uL (ref 0.7–4.0)
MCH: 31.6 pg (ref 26.0–34.0)
MCHC: 34 g/dL (ref 30.0–36.0)
MCV: 93 fL (ref 80.0–100.0)
Monocytes Absolute: 0.4 10*3/uL (ref 0.1–1.0)
Monocytes Relative: 5 %
Neutro Abs: 4.9 10*3/uL (ref 1.7–7.7)
Neutrophils Relative %: 67 %
Platelets: 240 10*3/uL (ref 150–400)
RBC: 3.7 MIL/uL — ABNORMAL LOW (ref 3.87–5.11)
RDW: 13.9 % (ref 11.5–15.5)
WBC: 7.2 10*3/uL (ref 4.0–10.5)
nRBC: 0 % (ref 0.0–0.2)

## 2022-09-07 LAB — CK: Total CK: 151 U/L (ref 38–234)

## 2022-09-07 LAB — TROPONIN I (HIGH SENSITIVITY)
Troponin I (High Sensitivity): 3 ng/L (ref <18)
Troponin I (High Sensitivity): 3 ng/L (ref <18)

## 2022-09-07 LAB — MAGNESIUM: Magnesium: 2.1 mg/dL (ref 1.7–2.4)

## 2022-09-07 MED ORDER — ACETAMINOPHEN 325 MG PO TABS
650.0000 mg | ORAL_TABLET | Freq: Once | ORAL | Status: AC
  Administered 2022-09-07: 650 mg via ORAL
  Filled 2022-09-07: qty 2

## 2022-09-07 MED ORDER — LACTATED RINGERS IV BOLUS
1000.0000 mL | Freq: Once | INTRAVENOUS | Status: AC
  Administered 2022-09-07: 1000 mL via INTRAVENOUS

## 2022-09-07 MED ORDER — KETOROLAC TROMETHAMINE 15 MG/ML IJ SOLN
15.0000 mg | Freq: Once | INTRAMUSCULAR | Status: AC
  Administered 2022-09-07: 15 mg via INTRAVENOUS
  Filled 2022-09-07: qty 1

## 2022-09-07 MED ORDER — MECLIZINE HCL 25 MG PO TABS
25.0000 mg | ORAL_TABLET | Freq: Once | ORAL | Status: AC
  Administered 2022-09-07: 25 mg via ORAL
  Filled 2022-09-07: qty 1

## 2022-09-07 NOTE — ED Notes (Signed)
Discharge paperwork given and verbally understood. 

## 2022-09-07 NOTE — ED Notes (Signed)
Pt aware of the need for a urine... Unable to currently provide the sample... 

## 2022-09-07 NOTE — ED Triage Notes (Signed)
Pt arrives pov, slow gait, endorses acute HA when driving to work today, and reports difficulty walking, endorses "feels unsteady like Im floating". Speech clear, aox4. LKW at 0729 today Also reports difficulty swallowing. Also reports CP

## 2022-09-07 NOTE — ED Notes (Signed)
Pt currently sleeping... Pt is snoring.... Pt is easy to wake up.Marland KitchenMarland Kitchen

## 2022-09-07 NOTE — Discharge Instructions (Signed)
You were seen in the emergency department for your headache, dizziness and chest pain.  Your workup showed no signs of heart attack or stress on your heart, no signs of stroke or bleeding in your brain and no abnormal heart rhythms.  It is unclear what caused your symptoms at this time but you can take Tylenol and Motrin as needed for headaches.  You can follow-up with your primary doctor or your neurologist to have your symptoms rechecked.  You should return to the emergency department if you have significantly worsening headache, repetitive vomiting, you are so dizzy that you pass out or you are unable to walk or if you have any other new or concerning symptoms.

## 2022-09-07 NOTE — ED Provider Notes (Signed)
[facility] EMERGENCY DEPARTMENT AT [facility] Provider Note   CSN: [id number] Arrival date & time: 09/07/22  3664     History  Chief Complaint  Patient presents with   Headache    Kathleen Sanders is a 32 y.o. female.  Patient is a 32 year old female with a past medical history of RA, fibromyalgia presenting to the emergency department with headache.  The patient states that she was on her way driving to work today when she had a sudden onset headache.  She states that it feels like a throbbing pain across her forehead and into her scalp.  She states that she feels dizzy like she is moving on a boat and has some light sensitivity.  She denies any nausea or vomiting.  She states that she feels generally weak all over.  She states that the left side of her body feels "more warm" than the right but denies any numbness.  She states that after the headache started she felt pain down the left side of her body including the left side of her chest with pain with taking deep breaths but did not feel short of breath.  He states that she feels like her body is tensing up.  She states that she has never had anything like this happen before.  She states that prior to this morning she was feeling well without any recent fevers, nausea, vomiting or diarrhea.  The history is provided by the patient.  Headache      Home Medications Prior to Admission medications   Medication Sig Start Date End Date Taking? Authorizing Provider  lamoTRIgine (LAMICTAL) 100 MG tablet Take 1 tablet by mouth 2 (two) times daily. 06/29/22  Yes [provider]  acetaminophen (TYLENOL) 325 MG tablet Take 325-650 mg by mouth every 6 (six) hours as needed (for pain/headache.).    [provider]  albuterol (VENTOLIN HFA) 108 (90 Base) MCG/ACT inhaler 1 puff every 4 (four) hours. 05/21/22   [provider]  celecoxib (CELEBREX) 200 MG capsule Take 200 mg by mouth daily.    [provider]   Cranberry 50 MG CHEW Chew by mouth.    [provider]  DULoxetine (CYMBALTA) 30 MG capsule Take 30 mg by mouth daily.    [provider]  DULoxetine (CYMBALTA) 60 MG capsule Take 120 mg by mouth daily.    [provider]  eszopiclone (LUNESTA) 2 MG TABS tablet Take 1 tab at night 03/17/22   [provider]  ferrous sulfate 325 (65 FE) MG EC tablet Take 325 mg by mouth 3 (three) times daily with meals.    [provider]  gabapentin (NEURONTIN) 300 MG capsule Patient takes 2 capsules in the morning and 1 capsule in the afternoon. 11/30/21   [provider]  hydroxychloroquine (PLAQUENIL) 200 MG tablet Take 1 tablet (200 mg total) by mouth daily. Take 1 tablet daily for first week 06/02/22   Fuller Plan, MD  lamoTRIgine (LAMICTAL) 100 MG tablet Take 200 mg by mouth daily.    [provider]  LORazepam (ATIVAN) 2 MG tablet  03/05/22   [provider]  Magnesium 300 MG CAPS Take by mouth.    [provider]  methocarbamol (ROBAXIN) 500 MG tablet Take 500 mg by mouth daily as needed. 04/01/22   [provider]  mirtazapine (REMERON) 15 MG tablet Take 7.5-15 mg by mouth at bedtime.    [provider]  nitrofurantoin, macrocrystal-monohydrate, (MACROBID) 100 MG capsule  Take 1 capsule (100 mg total) by mouth 2 (two) times daily. Patient not taking: Reported on 02/03/2022 10/27/21   Rondel Baton, MD  Probiotic Product (PROBIOTIC BLEND) CAPS Take by mouth.    [provider]  temazepam (RESTORIL) 30 MG capsule Take 30 mg by mouth at bedtime as needed.    [provider]      Allergies    Amoxicillin, Medroxyprogesterone, Penicillins, and Latex    Review of Systems   Review of Systems  Neurological:  Positive for headaches.    Physical Exam Updated Vital Signs BP 133/79   Pulse 69   Temp 98.2 F (36.8 C) (Oral)   Resp 19   LMP 08/17/2022   SpO2 97%  Physical  Exam Vitals and nursing note reviewed.  Constitutional:      Appearance: She is well-developed. She is obese. She is not toxic-appearing.     Comments: Mildly uncomfortable appearing  HENT:     Head: Normocephalic and atraumatic.     Mouth/Throat:     Mouth: Mucous membranes are moist.     Pharynx: Oropharynx is clear.  Eyes:     Extraocular Movements: Extraocular movements intact.     Pupils: Pupils are equal, round, and reactive to light.     Comments: No nystagmus  Cardiovascular:     Rate and Rhythm: Normal rate and regular rhythm.     Heart sounds: Normal heart sounds.  Pulmonary:     Effort: Pulmonary effort is normal.     Breath sounds: Normal breath sounds.  Abdominal:     Palpations: Abdomen is soft.     Tenderness: There is no abdominal tenderness.  Musculoskeletal:        General: Normal range of motion.     Cervical back: Normal range of motion and neck supple.  Skin:    General: Skin is warm and dry.  Neurological:     Mental Status: She is alert and oriented to person, place, and time.     GCS: GCS eye subscore is 4. GCS verbal subscore is 5. GCS motor subscore is 6.     Cranial Nerves: No cranial nerve deficit, dysarthria or facial asymmetry.     Sensory: No sensory deficit (Reports left side of body "feels warm").     Motor: No weakness.     Coordination: Coordination normal.  Psychiatric:        Mood and Affect: Mood normal.        Speech: Speech normal.        Behavior: Behavior normal.     ED Results / Procedures / Treatments   Labs (all labs ordered are listed, but only abnormal results are displayed) Labs Reviewed  CBC WITH DIFFERENTIAL/PLATELET - Abnormal; Notable for the following components:      Result Value   RBC 3.70 (*)    Hemoglobin 11.7 (*)    HCT 34.4 (*)    All other components within normal limits  COMPREHENSIVE METABOLIC PANEL  CK  MAGNESIUM  URINALYSIS, ROUTINE W REFLEX MICROSCOPIC  PREGNANCY, URINE  TROPONIN I (HIGH  SENSITIVITY)  TROPONIN I (HIGH SENSITIVITY)    EKG EKG Interpretation Date/Time:  Tuesday September 07 2022 08:23:40 EDT Ventricular Rate:  85 PR Interval:  156 QRS Duration:  103 QT Interval:  410 QTC Calculation: 488 R Axis:   -24  Text Interpretation: Sinus rhythm Borderline left axis deviation Borderline prolonged QT interval No significant change since last tracing with limb reversal correction compared  to prior EKG today Confirmed by Elayne Snare ([id number]) on 09/07/2022 8:52:45 AM  Radiology CT Head Wo Contrast  Result Date: 09/07/2022 CLINICAL DATA:  32 year old female with sudden severe headache when driving to work today. Difficulty walking. Chest pain. EXAM: CT HEAD WITHOUT CONTRAST TECHNIQUE: Contiguous axial images were obtained from the base of the skull through the vertex without intravenous contrast. RADIATION DOSE REDUCTION: This exam was performed according to the departmental dose-optimization program which includes automated exposure control, adjustment of the mA and/or kV according to patient size and/or use of iterative reconstruction technique. COMPARISON:  Brain MRI 01/19/2022. FINDINGS: Brain: Cystic change of the pineal gland again noted (series 2, image 13, about 12 mm and stable. No regional mass effect. Cerebral volume is within normal limits. No midline shift, ventriculomegaly, mass effect, evidence of mass lesion, intracranial hemorrhage or evidence of cortically based acute infarction. Gray-white matter differentiation is within normal limits throughout the brain. Vascular: No suspicious intracranial vascular hyperdensity. Skull: Negative. Sinuses/Orbits: Tympanic cavities, visualized paranasal sinuses and mastoids are clear. Other: Visualized orbits and scalp soft tissues are within normal limits. 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 Simple pineal cysts  such as this are common and in general produce no symptoms. When large they have sometimes been implicated in Parinaud's Syndrome. Electronically Signed   By: Odessa Fleming M.D.   On: 09/07/2022 09:21    Procedures Procedures    Medications Ordered in ED Medications  acetaminophen (TYLENOL) tablet 650 mg (650 mg Oral Given 09/07/22 0949)  lactated ringers bolus 1,000 mL (0 mLs Intravenous Stopped 09/07/22 1034)  meclizine (ANTIVERT) tablet 25 mg (25 mg Oral Given 09/07/22 0949)  ketorolac (TORADOL) 15 MG/ML injection 15 mg (15 mg Intravenous Given 09/07/22 1052)    ED Course/ Medical Decision Making/ A&P Clinical Course as of 09/07/22 1434  Tue Sep 07, 2022  1045 CTH negative for acute abnormality. [VK]  1432 Upon reassessment, patient reports that her headache and dizziness has resolved.  She states that she is just having some left hip pain which she relates to her chronic back pain.  She is able to ambulate steadily in the room.  She stable for discharge home with outpatient follow-up and was given strict return precautions. [VK]    Clinical Course User Index [VK] Rexford Maus, DO                                 Medical Decision Making This patient presents to the ED with chief complaint(s) of headache, dizziness with pertinent past medical history of RA, fibromyalgia which further complicates the presenting complaint. The complaint involves an extensive differential diagnosis and also carries with it a high risk of complications and morbidity.    The differential diagnosis includes considering ICH, mass effect, CVA, TIA, hypo or hyperglycemia, electrolyte abnormality, ACS, arrhythmia, anemia, infection  Additional history obtained: Additional history obtained from N/A Records reviewed outpatient neurology and rheumatology records  ED Course and Reassessment: On patient's arrival she is hemodynamically stable, mildly uncomfortable appearing though in no acute distress.  EKG on  arrival showed normal sinus rhythm initially lead reversal but repeat EKG had no acute ischemic changes.  The patient will have labs including troponin, head CT and will be treated with fluids, meclizine and Tylenol for symptomatic management and will be closely reassessed.  Independent labs interpretation:  The following labs were independently interpreted: within normal range  Independent visualization of imaging: - I independently visualized the following imaging with scope of interpretation limited to determining acute life threatening conditions related to emergency care: CTH, which revealed no acute disease  Consultation: - Consulted or discussed management/test interpretation w/ external professional: N/A  Consideration for admission or further workup: Patient has no emergent conditions requiring admission or further work-up at this time and is stable for discharge home with primary care follow-up  Social Determinants of health: N/A    Amount and/or Complexity of Data Reviewed Labs: ordered. Radiology: ordered.  Risk OTC drugs. Prescription drug management.          Final Clinical Impression(s) / ED Diagnoses Final diagnoses:  Acute nonintractable headache, unspecified headache type  Dizziness    Rx / DC Orders ED Discharge Orders     None         Rexford Maus, DO 09/07/22 1434

## 2022-09-07 NOTE — ED Notes (Signed)
Pt still unable to currently provide the sample.Marland KitchenMarland Kitchen

## 2022-09-22 ENCOUNTER — Encounter (HOSPITAL_BASED_OUTPATIENT_CLINIC_OR_DEPARTMENT_OTHER): Payer: Self-pay | Admitting: Emergency Medicine

## 2022-09-22 ENCOUNTER — Emergency Department (HOSPITAL_BASED_OUTPATIENT_CLINIC_OR_DEPARTMENT_OTHER): Payer: Self-pay

## 2022-09-22 ENCOUNTER — Other Ambulatory Visit: Payer: Self-pay

## 2022-09-22 ENCOUNTER — Emergency Department (HOSPITAL_BASED_OUTPATIENT_CLINIC_OR_DEPARTMENT_OTHER)
Admission: EM | Admit: 2022-09-22 | Discharge: 2022-09-22 | Disposition: A | Discharge status: Home / Self Care | Payer: Self-pay | Attending: Emergency Medicine | Admitting: Emergency Medicine

## 2022-09-22 DIAGNOSIS — J45909 Unspecified asthma, uncomplicated: Secondary | ICD-10-CM | POA: Insufficient documentation

## 2022-09-22 DIAGNOSIS — Z1152 Encounter for screening for COVID-19: Secondary | ICD-10-CM | POA: Insufficient documentation

## 2022-09-22 DIAGNOSIS — R52 Pain, unspecified: Secondary | ICD-10-CM

## 2022-09-22 DIAGNOSIS — R059 Cough, unspecified: Secondary | ICD-10-CM

## 2022-09-22 DIAGNOSIS — G43809 Other migraine, not intractable, without status migrainosus: Secondary | ICD-10-CM

## 2022-09-22 DIAGNOSIS — M2550 Pain in unspecified joint: Secondary | ICD-10-CM

## 2022-09-22 DIAGNOSIS — Z91040 Latex allergy status: Secondary | ICD-10-CM | POA: Insufficient documentation

## 2022-09-22 HISTORY — DX: Rheumatoid arthritis, unspecified: M06.9

## 2022-09-22 LAB — CBC WITH DIFFERENTIAL/PLATELET
Abs Immature Granulocytes: 0.02 10*3/uL (ref 0.00–0.07)
Basophils Absolute: 0.1 10*3/uL (ref 0.0–0.1)
Basophils Relative: 1 %
Eosinophils Absolute: 0.3 10*3/uL (ref 0.0–0.5)
Eosinophils Relative: 4 %
HCT: 36.8 % (ref 36.0–46.0)
Hemoglobin: 12.4 g/dL (ref 12.0–15.0)
Immature Granulocytes: 0 %
Lymphocytes Relative: 30 %
Lymphs Abs: 2.2 10*3/uL (ref 0.7–4.0)
MCH: 31.5 pg (ref 26.0–34.0)
MCHC: 33.7 g/dL (ref 30.0–36.0)
MCV: 93.4 fL (ref 80.0–100.0)
Monocytes Absolute: 0.4 10*3/uL (ref 0.1–1.0)
Monocytes Relative: 6 %
Neutro Abs: 4.4 10*3/uL (ref 1.7–7.7)
Neutrophils Relative %: 59 %
Platelets: 233 10*3/uL (ref 150–400)
RBC: 3.94 MIL/uL (ref 3.87–5.11)
RDW: 14.6 % (ref 11.5–15.5)
WBC: 7.4 10*3/uL (ref 4.0–10.5)
nRBC: 0 % (ref 0.0–0.2)

## 2022-09-22 LAB — COMPREHENSIVE METABOLIC PANEL
ALT: 20 U/L (ref 0–44)
AST: 19 U/L (ref 15–41)
Albumin: 4 g/dL (ref 3.5–5.0)
Alkaline Phosphatase: 48 U/L (ref 38–126)
Anion gap: 12 (ref 5–15)
BUN: 9 mg/dL (ref 6–20)
CO2: 24 mmol/L (ref 22–32)
Calcium: 9 mg/dL (ref 8.9–10.3)
Chloride: 103 mmol/L (ref 98–111)
Creatinine, Ser: 0.91 mg/dL (ref 0.44–1.00)
GFR, Estimated: >60 mL/min (ref >60)
Glucose, Bld: 134 mg/dL — ABNORMAL HIGH (ref 70–99)
Potassium: 3.8 mmol/L (ref 3.5–5.1)
Sodium: 139 mmol/L (ref 135–145)
Total Bilirubin: 0.5 mg/dL (ref 0.3–1.2)
Total Protein: 7.1 g/dL (ref 6.5–8.1)

## 2022-09-22 LAB — RESP PANEL BY RT-PCR (RSV, FLU A&B, COVID)  RVPGX2
Influenza A by PCR: NEGATIVE
Influenza B by PCR: NEGATIVE
Resp Syncytial Virus by PCR: NEGATIVE
SARS Coronavirus 2 by RT PCR: NEGATIVE

## 2022-09-22 LAB — TROPONIN I (HIGH SENSITIVITY): Troponin I (High Sensitivity): <2 ng/L (ref <18)

## 2022-09-22 MED ORDER — DEXAMETHASONE SODIUM PHOSPHATE 10 MG/ML IJ SOLN
10.0000 mg | Freq: Once | INTRAMUSCULAR | Status: AC
  Administered 2022-09-22: 10 mg via INTRAVENOUS
  Filled 2022-09-22: qty 1

## 2022-09-22 MED ORDER — LACTATED RINGERS IV BOLUS
1000.0000 mL | Freq: Once | INTRAVENOUS | Status: AC
  Administered 2022-09-22: 1000 mL via INTRAVENOUS

## 2022-09-22 MED ORDER — KETOROLAC TROMETHAMINE 30 MG/ML IJ SOLN
30.0000 mg | Freq: Once | INTRAMUSCULAR | Status: AC
  Administered 2022-09-22: 30 mg via INTRAVENOUS
  Filled 2022-09-22: qty 1

## 2022-09-22 NOTE — ED Provider Notes (Signed)
[place] [facility] Provider Note   CSN: [id number] Arrival date & time: 09/22/22  0807     History  Chief Complaint  Patient presents with   Headache    Kathleen Sanders is a 32 y.o. female.  HPI     32yo female with history of migraine, PCOS, RA, fibromyalgia who presents with concern for headache, body aches.    Reports has had cough for last 3-4 days, had sore throat and diarrhea days ago. Son is also sick.  Feels like cough improving but still having some sensation of chest tightness, dyspnea.   Today, at work at 730AM began to have sensation of warmth to left side of the face, and still feels like left side feels hot, began to have headache that started slowly nad has been worsening since then. Headache is worsened by bright lights, loud sounds. Now developing nausea with it.  No vomiting.  No known fevers. No trauma. Does not have daily headaches, no hx of exertional headaches and no fam hx of aneurysm.  After her headache started began to feel sensation of aching all over body-wrists, fingers bilaterally, shoulders, pain now all over the body. Everything hurts.   Sees Dr. Dimple Casey for RA.  Reports has tingling to bilateral hands and feet which is not unusual for her.   Has history of migraine in the past, complicated migraines in past.      Past Medical History:  Diagnosis Date   Anemia    Asthma    seasonal   Degenerative disc disease, lumbar    Fibroid    Fibromyalgia    HNP (herniated nucleus pulposus), thoracic    Hx of chronic pyelonephritis    Migraine with aura    PCOS (polycystic ovarian syndrome)    Pyelonephritis    Rheumatoid arthritis (HCC)    UTI (urinary tract infection)     Home Medications Prior to Admission medications   Medication Sig Start Date End Date Taking? Authorizing Provider  acetaminophen (TYLENOL) 325 MG tablet Take 325-650 mg by mouth every 6 (six) hours as needed (for pain/headache.).    [provider]  albuterol (VENTOLIN HFA) 108 (90 Base) MCG/ACT inhaler 1 puff every 4 (four) hours. 05/21/22   [provider]  celecoxib (CELEBREX) 200 MG capsule Take 200 mg by mouth daily.    [provider]  Cranberry 50 MG CHEW Chew by mouth.    [provider]  DULoxetine (CYMBALTA) 30 MG capsule Take 30 mg by mouth daily.    [provider]  DULoxetine (CYMBALTA) 60 MG capsule Take 120 mg by mouth daily.    [provider]  eszopiclone (LUNESTA) 2 MG TABS tablet Take 1 tab at night 03/17/22   [provider]  ferrous sulfate 325 (65 FE) MG EC tablet Take 325 mg by mouth 3 (three) times daily with meals.    [provider]  gabapentin (NEURONTIN) 300 MG capsule Patient takes 2 capsules in the morning and 1 capsule in the afternoon. 11/30/21   [provider]  hydroxychloroquine (PLAQUENIL) 200 MG tablet Take 1 tablet (200 mg total) by mouth daily. Take 1 tablet daily for first week 06/02/22   Fuller Plan, MD  lamoTRIgine (LAMICTAL) 100 MG tablet Take 200 mg by mouth daily.    [provider]  lamoTRIgine (LAMICTAL) 100 MG tablet Take 1 tablet by mouth 2 (two) times daily. 06/29/22   [provider]  LORazepam (ATIVAN)  2 MG tablet  03/05/22   [provider]  Magnesium 300 MG CAPS Take by mouth.    [provider]  methocarbamol (ROBAXIN) 500 MG tablet Take 500 mg by mouth daily as needed. 04/01/22   [provider]  mirtazapine (REMERON) 15 MG tablet Take 7.5-15 mg by mouth at bedtime.    [provider]  nitrofurantoin, macrocrystal-monohydrate, (MACROBID) 100 MG capsule Take 1 capsule (100 mg total) by mouth 2 (two) times daily. Patient not taking: Reported on 02/03/2022 10/27/21   Rondel Baton, MD  Probiotic Product (PROBIOTIC BLEND) CAPS Take by mouth.    [provider]  temazepam (RESTORIL) 30 MG capsule Take 30 mg by mouth at bedtime as needed.     [provider]      Allergies    Amoxicillin, Medroxyprogesterone, Penicillins, and Latex    Review of Systems   Review of Systems  Physical Exam Updated Vital Signs BP 123/79   Pulse 96   Temp 97.9 F (36.6 C) (Oral)   Resp (!) 29   Ht 5\' 6"  (1.676 m)   Wt 129.3 kg   LMP 09/22/2022 (Exact Date)   SpO2 98%   BMI 46.00 kg/m  Physical Exam Vitals and nursing note reviewed.  Constitutional:      General: She is not in acute distress.    Appearance: Normal appearance. She is well-developed. She is not ill-appearing or diaphoretic.  HENT:     Head: Normocephalic and atraumatic.  Eyes:     General: No visual field deficit.    Extraocular Movements: Extraocular movements intact.     Conjunctiva/sclera: Conjunctivae normal.     Pupils: Pupils are equal, round, and reactive to light.  Cardiovascular:     Rate and Rhythm: Normal rate and regular rhythm.     Pulses: Normal pulses.     Heart sounds: Normal heart sounds. No murmur heard.    No friction rub. No gallop.  Pulmonary:     Effort: Pulmonary effort is normal. No respiratory distress.     Breath sounds: Normal breath sounds. No wheezing or rales.  Abdominal:     General: There is no distension.     Palpations: Abdomen is soft.     Tenderness: There is no abdominal tenderness. There is no guarding.  Musculoskeletal:        General: No swelling or tenderness.     Cervical back: Normal range of motion.  Skin:    General: Skin is warm and dry.     Findings: No erythema or rash.  Neurological:     General: No focal deficit present.     Mental Status: She is alert and oriented to person, place, and time.     GCS: GCS eye subscore is 4. GCS verbal subscore is 5. GCS motor subscore is 6.     Cranial Nerves: No cranial nerve deficit, dysarthria or facial asymmetry.     Sensory: No sensory deficit (reports face feels warm on left).     Motor: No weakness or tremor.     Coordination: Coordination normal.  Finger-Nose-Finger Test normal.     Gait: Gait normal.     ED Results / Procedures / Treatments   Labs (all labs ordered are listed, but only abnormal results are displayed) Labs Reviewed  COMPREHENSIVE METABOLIC PANEL - Abnormal; Notable for the following components:      Result Value   Glucose, Bld 134 (*)    All other components within normal  limits  RESP PANEL BY RT-PCR (RSV, FLU A&B, COVID)  RVPGX2  CBC WITH DIFFERENTIAL/PLATELET  TROPONIN I (HIGH SENSITIVITY)    EKG EKG Interpretation Date/Time:  Wednesday September 22 2022 08:52:27 EDT Ventricular Rate:  87 PR Interval:  145 QRS Duration:  99 QT Interval:  387 QTC Calculation: 466 R Axis:   -29  Text Interpretation: Sinus rhythm Borderline left axis deviation No significant change since last tracing Confirmed by Alvira Monday ([id number]) on 09/22/2022 9:12:52 AM  Radiology DG Chest 2 View  Result Date: 09/22/2022 CLINICAL DATA:  Cough Migraine Weakness EXAM: CHEST - 2 VIEW COMPARISON:  05/21/2020 FINDINGS: Cardiomediastinal silhouette and pulmonary vasculature are within normal limits. Lungs are clear. IMPRESSION: No acute cardiopulmonary process. Electronically Signed   By: Acquanetta Belling M.D.   On: 09/22/2022 10:00    Procedures Procedures    Medications Ordered in ED Medications  dexamethasone (DECADRON) injection 10 mg (10 mg Intravenous Given 09/22/22 0900)  lactated ringers bolus 1,000 mL (0 mLs Intravenous Stopped 09/22/22 1009)  ketorolac (TORADOL) 30 MG/ML injection 30 mg (30 mg Intravenous Given 09/22/22 0900)    ED Course/ Medical Decision Making/ A&P                                  32yo female with history of migraine, PCOS, RA, fibromyalgia who presents with concern for headache, body aches.   Regarding headache---seen a few weeks ago 8/20 for headache and had CT without acute findings--did have stable small pineal cysts are common and in general do not produce symptoms, sometimes have been implicated in  Parinaud's syndrome.  Given headache slowly worsening, do not feel history consistent with SAH.  Does not have focal abnormalities on neurologic exam (bilateral distal tingling has been present prior), and describes postiive symptoms of warmth rather than numbness.  In the setting of headache with migrainous features suspect headache, and sensation of warmth of face consistent with complicated migraine. At this time do not feel repeat head CT imaging indicated emergently today.  She is driving home and declines sedating types of headache cocktails at this time.  Will plan on decadron for migraine/as well as treatment of body aches in setting of RA>  Given toradol as well.  Given other symptoms-including cough, chest tightness, body aches---ddx incluces COVID 19, pneumonia, other viral syndrome, RA flare, electrolyte abnormality, anemia.  EKG completed and evaluated by me shows normal sinus rhythm without acute ST changes.   Labs completed and personally about interpreted by me show no anemia, no leukocytosis, no clinically significant electrolyte abnormalities.  Troponin negative and have low suspicion for ACS in setting of her sensation of chest tightness.  Chest x-ray completed and shows no evidence of pneumonia, pneumothorax or pulmonary edema.  COVID, flu and RSV testing are negative.  She is feeling improved following Toradol and Decadron.  Recommend outpatient follow-up with neurology, PCP and rheumatologist.  Suspect most likely a viral syndrome triggering migraine, or consider possible rheumatologic flare.        Final Clinical Impression(s) / ED Diagnoses Final diagnoses:  Other migraine without status migrainosus, not intractable  Cough, unspecified type  Body aches  Polyarthralgia    Rx / DC Orders ED Discharge Orders     None         Alvira Monday, MD 09/24/22 [phone]

## 2022-09-22 NOTE — ED Triage Notes (Signed)
Pt states she started to have a headache at 0730.  Pt noticed the left side of her face felt hot, and had generalized weakness.  Pt ambulated to triage without difficulty.
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
## 2022-12-07 ENCOUNTER — Ambulatory Visit: Payer: 59 | Admitting: Internal Medicine

## 2022-12-07 DIAGNOSIS — M2550 Pain in unspecified joint: Secondary | ICD-10-CM

## 2022-12-07 DIAGNOSIS — R768 Other specified abnormal immunological findings in serum: Secondary | ICD-10-CM

## 2022-12-07 DIAGNOSIS — M797 Fibromyalgia: Secondary | ICD-10-CM

## 2022-12-07 DIAGNOSIS — G8929 Other chronic pain: Secondary | ICD-10-CM

## 2022-12-07 DIAGNOSIS — Z79899 Other long term (current) drug therapy: Secondary | ICD-10-CM

## 2022-12-22 ENCOUNTER — Emergency Department (HOSPITAL_BASED_OUTPATIENT_CLINIC_OR_DEPARTMENT_OTHER)
Admission: EM | Admit: 2022-12-22 | Discharge: 2022-12-22 | Discharge status: Against Medical Advice | Payer: Self-pay | Attending: Emergency Medicine | Admitting: Emergency Medicine

## 2022-12-22 ENCOUNTER — Other Ambulatory Visit: Payer: Self-pay

## 2022-12-22 ENCOUNTER — Emergency Department (HOSPITAL_BASED_OUTPATIENT_CLINIC_OR_DEPARTMENT_OTHER): Payer: Self-pay | Admitting: Radiology

## 2022-12-22 DIAGNOSIS — J069 Acute upper respiratory infection, unspecified: Secondary | ICD-10-CM | POA: Insufficient documentation

## 2022-12-22 DIAGNOSIS — Z5321 Procedure and treatment not carried out due to patient leaving prior to being seen by health care provider: Secondary | ICD-10-CM | POA: Insufficient documentation

## 2022-12-22 LAB — PREGNANCY, URINE: Preg Test, Ur: NEGATIVE

## 2022-12-22 LAB — CBC
HCT: 35.9 % — ABNORMAL LOW (ref 36.0–46.0)
Hemoglobin: 12.3 g/dL (ref 12.0–15.0)
MCH: 31.8 pg (ref 26.0–34.0)
MCHC: 34.3 g/dL (ref 30.0–36.0)
MCV: 92.8 fL (ref 80.0–100.0)
Platelets: 250 10*3/uL (ref 150–400)
RBC: 3.87 MIL/uL (ref 3.87–5.11)
RDW: 12.7 % (ref 11.5–15.5)
WBC: 8.8 10*3/uL (ref 4.0–10.5)
nRBC: 0 % (ref 0.0–0.2)

## 2022-12-22 LAB — BASIC METABOLIC PANEL
Anion gap: 9 (ref 5–15)
BUN: 9 mg/dL (ref 6–20)
CO2: 27 mmol/L (ref 22–32)
Calcium: 9.6 mg/dL (ref 8.9–10.3)
Chloride: 104 mmol/L (ref 98–111)
Creatinine, Ser: 0.94 mg/dL (ref 0.44–1.00)
GFR, Estimated: >60 mL/min (ref >60)
Glucose, Bld: 142 mg/dL — ABNORMAL HIGH (ref 70–99)
Potassium: 3.9 mmol/L (ref 3.5–5.1)
Sodium: 140 mmol/L (ref 135–145)

## 2022-12-22 LAB — TROPONIN I (HIGH SENSITIVITY): Troponin I (High Sensitivity): <2 ng/L (ref <18)

## 2022-12-22 MED ORDER — ALBUTEROL SULFATE HFA 108 (90 BASE) MCG/ACT IN AERS: 2.0000 | INHALATION_SPRAY | RESPIRATORY_TRACT | Status: DC | PRN

## 2022-12-22 NOTE — ED Notes (Signed)
Unable to locate in lobby. 

## 2022-12-22 NOTE — ED Triage Notes (Signed)
Pt POV reporting URI sx, SOB, chest pain. RA and suspected lupus, states she normally gets pneumonia this time of year. Gen body aches, denies fever.

## 2023-02-21 ENCOUNTER — Ambulatory Visit: Payer: Self-pay | Admitting: Internal Medicine
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
## 2023-02-24 ENCOUNTER — Telehealth: Payer: Self-pay | Admitting: Internal Medicine

## 2023-02-24 ENCOUNTER — Other Ambulatory Visit: Payer: Self-pay | Admitting: Internal Medicine

## 2023-02-24 DIAGNOSIS — R768 Other specified abnormal immunological findings in serum: Secondary | ICD-10-CM

## 2023-02-24 NOTE — Telephone Encounter (Signed)
 Office visit note faxed.

## 2023-02-24 NOTE — Telephone Encounter (Signed)
 Last Fill: 06/02/2022  Eye exam: not on file    Labs: 12/22/2022  HCT 35.9 Glucose 142  Next Visit: 03/08/2023  Last Visit: 06/02/2022  IK:[id number]   Current Dose per office note 06/02/2022: hydroxychloroquine  200 mg daily   Contacted the patient regarding a PLQ eye exam. Patient states she is going to call to try to schedule one and if she needs a referral she will give us  a call back. Patient states if she schedules one, she will call us  with the date.   Okay to refill Plaquenil ?

## 2023-02-24 NOTE — Telephone Encounter (Signed)
 Patient called stating she is scheduled for her Plaquenil  eye exam on 03/22/23 at [facility].  Patient states Dickie Found is requesting a copy of her last office visit note be faxed to their office. Fax #[phone]

## 2023-02-24 NOTE — Telephone Encounter (Signed)
 Office notes faxed

## 2023-03-08 ENCOUNTER — Other Ambulatory Visit: Payer: Self-pay | Admitting: Internal Medicine

## 2023-03-08 ENCOUNTER — Ambulatory Visit: Payer: No Typology Code available for payment source | Attending: Internal Medicine | Admitting: Internal Medicine

## 2023-03-08 ENCOUNTER — Encounter: Payer: Self-pay | Admitting: Internal Medicine

## 2023-03-08 VITALS — BP 117/76 | HR 78 | Resp 14 | Ht 66.0 in | Wt 281.0 lb

## 2023-03-08 DIAGNOSIS — M797 Fibromyalgia: Secondary | ICD-10-CM | POA: Diagnosis not present

## 2023-03-08 DIAGNOSIS — R700 Elevated erythrocyte sedimentation rate: Secondary | ICD-10-CM

## 2023-03-08 DIAGNOSIS — R768 Other specified abnormal immunological findings in serum: Secondary | ICD-10-CM

## 2023-03-08 DIAGNOSIS — M2550 Pain in unspecified joint: Secondary | ICD-10-CM | POA: Diagnosis not present

## 2023-03-08 DIAGNOSIS — R200 Anesthesia of skin: Secondary | ICD-10-CM

## 2023-03-08 DIAGNOSIS — Z79899 Other long term (current) drug therapy: Secondary | ICD-10-CM

## 2023-03-09 LAB — SEDIMENTATION RATE: Sed Rate: 19 mm/h (ref 0–20)

## 2023-03-25 ENCOUNTER — Other Ambulatory Visit: Payer: Self-pay | Admitting: Internal Medicine

## 2023-03-25 DIAGNOSIS — R768 Other specified abnormal immunological findings in serum: Secondary | ICD-10-CM

## 2023-05-08 ENCOUNTER — Emergency Department (HOSPITAL_BASED_OUTPATIENT_CLINIC_OR_DEPARTMENT_OTHER)
Admission: EM | Admit: 2023-05-08 | Discharge: 2023-05-08 | Disposition: A | Discharge status: Home / Self Care | Attending: Emergency Medicine | Admitting: Emergency Medicine

## 2023-05-08 ENCOUNTER — Encounter (HOSPITAL_BASED_OUTPATIENT_CLINIC_OR_DEPARTMENT_OTHER): Payer: Self-pay | Admitting: Emergency Medicine

## 2023-05-08 ENCOUNTER — Emergency Department (HOSPITAL_BASED_OUTPATIENT_CLINIC_OR_DEPARTMENT_OTHER)

## 2023-05-08 DIAGNOSIS — Z91040 Latex allergy status: Secondary | ICD-10-CM | POA: Diagnosis not present

## 2023-05-08 DIAGNOSIS — K610 Anal abscess: Secondary | ICD-10-CM | POA: Diagnosis not present

## 2023-05-08 DIAGNOSIS — K6289 Other specified diseases of anus and rectum: Secondary | ICD-10-CM | POA: Diagnosis present

## 2023-05-08 LAB — COMPREHENSIVE METABOLIC PANEL WITH GFR
ALT: 21 U/L (ref 0–44)
AST: 18 U/L (ref 15–41)
Albumin: 4.3 g/dL (ref 3.5–5.0)
Alkaline Phosphatase: 59 U/L (ref 38–126)
Anion gap: 8 (ref 5–15)
BUN: 12 mg/dL (ref 6–20)
CO2: 25 mmol/L (ref 22–32)
Calcium: 9 mg/dL (ref 8.9–10.3)
Chloride: 105 mmol/L (ref 98–111)
Creatinine, Ser: 0.94 mg/dL (ref 0.44–1.00)
GFR, Estimated: >60 mL/min (ref >60)
Glucose, Bld: 105 mg/dL — ABNORMAL HIGH (ref 70–99)
Potassium: 3.8 mmol/L (ref 3.5–5.1)
Sodium: 138 mmol/L (ref 135–145)
Total Bilirubin: 0.6 mg/dL (ref 0.0–1.2)
Total Protein: 6.6 g/dL (ref 6.5–8.1)

## 2023-05-08 LAB — URINALYSIS, ROUTINE W REFLEX MICROSCOPIC
Bacteria, UA: NONE SEEN
Bilirubin Urine: NEGATIVE
Glucose, UA: NEGATIVE mg/dL
Hgb urine dipstick: NEGATIVE
Ketones, ur: NEGATIVE mg/dL
Nitrite: NEGATIVE
Protein, ur: 30 mg/dL — AB
Specific Gravity, Urine: 1.03 (ref 1.005–1.030)
pH: 6 (ref 5.0–8.0)

## 2023-05-08 LAB — CBC WITH DIFFERENTIAL/PLATELET
Abs Immature Granulocytes: 0.02 10*3/uL (ref 0.00–0.07)
Basophils Absolute: 0 10*3/uL (ref 0.0–0.1)
Basophils Relative: 0 %
Eosinophils Absolute: 0.1 10*3/uL (ref 0.0–0.5)
Eosinophils Relative: 1 %
HCT: 34.5 % — ABNORMAL LOW (ref 36.0–46.0)
Hemoglobin: 12 g/dL (ref 12.0–15.0)
Immature Granulocytes: 0 %
Lymphocytes Relative: 14 %
Lymphs Abs: 1.4 10*3/uL (ref 0.7–4.0)
MCH: 31.7 pg (ref 26.0–34.0)
MCHC: 34.8 g/dL (ref 30.0–36.0)
MCV: 91.3 fL (ref 80.0–100.0)
Monocytes Absolute: 0.6 10*3/uL (ref 0.1–1.0)
Monocytes Relative: 6 %
Neutro Abs: 7.9 10*3/uL — ABNORMAL HIGH (ref 1.7–7.7)
Neutrophils Relative %: 79 %
Platelets: 228 10*3/uL (ref 150–400)
RBC: 3.78 MIL/uL — ABNORMAL LOW (ref 3.87–5.11)
RDW: 13.2 % (ref 11.5–15.5)
WBC: 10.1 10*3/uL (ref 4.0–10.5)
nRBC: 0 % (ref 0.0–0.2)

## 2023-05-08 LAB — PREGNANCY, URINE: Preg Test, Ur: NEGATIVE

## 2023-05-08 LAB — LIPASE, BLOOD: Lipase: 31 U/L (ref 11–51)

## 2023-05-08 MED ORDER — CLINDAMYCIN PHOSPHATE 600 MG/50ML IV SOLN
600.0000 mg | Freq: Once | INTRAVENOUS | Status: AC
  Administered 2023-05-08: 600 mg via INTRAVENOUS
  Filled 2023-05-08: qty 50

## 2023-05-08 MED ORDER — IBUPROFEN 600 MG PO TABS: 600.0000 mg | ORAL_TABLET | Freq: Four times a day (QID) | ORAL | 0 refills | Status: AC | PRN

## 2023-05-08 MED ORDER — IOHEXOL 300 MG/ML  SOLN
100.0000 mL | Freq: Once | INTRAMUSCULAR | Status: AC | PRN
  Administered 2023-05-08: 100 mL via INTRAVENOUS

## 2023-05-08 MED ORDER — CLINDAMYCIN HCL 300 MG PO CAPS: 300.0000 mg | ORAL_CAPSULE | Freq: Four times a day (QID) | ORAL | 0 refills | Status: DC

## 2023-05-08 NOTE — ED Notes (Signed)
 Patient transported to CT

## 2023-05-08 NOTE — ED Triage Notes (Signed)
"  Lump" on rectum Painful Notice today Denies draining or bleeding

## 2023-05-08 NOTE — ED Provider Notes (Signed)
 [place] EMERGENCY DEPARTMENT AT [facility] Provider Note   CSN: [id number] Arrival date & time: 05/08/23  1558     History  Chief Complaint  Patient presents with   Abscess    Kathleen Sanders is a 33 y.o. female.  The history is provided by the patient and medical records. No language interpreter was used.  Abscess    33 year old female history of rheumatoid arthritis currently on Plaquenil , PCOS, uterine fibroid, anemia presenting with complaint of rectal pain.  Patient report acute onset of sharp stabbing pain about her rectum that started early this morning and wakes her up from sleep.  Pain is moderate in severity, worse with sitting, palpation and with movement.  She is afraid to have bowel movement due to the discomfort.  She did mention having diarrhea for the past week but denies any constipation and denies straining.  She has not noticed any blood in the stool.  She denies having fever or chills no nausea or vomiting.  She denies any rectal trauma.  No specific treatment tried.  Home Medications Prior to Admission medications   Medication Sig Start Date End Date Taking? Authorizing Provider  acetaminophen  (TYLENOL ) 325 MG tablet Take 325-650 mg by mouth every 6 (six) hours as needed (for pain/headache.).    [provider]  albuterol  (VENTOLIN  HFA) 108 (90 Base) MCG/ACT inhaler 1 puff every 4 (four) hours. Patient not taking: Reported on 03/08/2023 05/21/22   [provider]  ALPRAZolam (XANAX) 1 MG tablet Take 1 mg by mouth 2 (two) times daily. 02/10/23   [provider]  clindamycin  (CLEOCIN ) 150 MG capsule Take 150 mg by mouth 3 (three) times daily. 02/09/23   [provider]  Cranberry 50 MG CHEW Chew by mouth.    [provider]  DULoxetine (CYMBALTA) 60 MG capsule Take 60 mg by mouth daily.    [provider]  ferrous sulfate 325 (65 FE) MG EC tablet Take 325 mg by mouth 3 (three) times daily with meals.     [provider]  gabapentin (NEURONTIN) 300 MG capsule Patient takes 2 capsules in the morning and 1 capsule in the afternoon. 11/30/21   [provider]  hydroxychloroquine  (PLAQUENIL ) 200 MG tablet TAKE ONE TABLET BY MOUTH ONE TIME DAILY 02/24/23   Matt Song, MD  hydrOXYzine (ATARAX) 25 MG tablet Take 25 mg by mouth 2 (two) times daily. 03/05/23   [provider]  lamoTRIgine (LAMICTAL) 100 MG tablet Take 1 tablet by mouth 2 (two) times daily. Patient not taking: Reported on 03/08/2023 06/29/22   [provider]  Magnesium 300 MG CAPS Take by mouth.    [provider]  mirtazapine (REMERON) 15 MG tablet Take 7.5-15 mg by mouth at bedtime.    [provider]  Probiotic Product (PROBIOTIC BLEND) CAPS Take by mouth.    [provider]      Allergies    Amoxicillin, Medroxyprogesterone , Penicillins, and Latex    Review of Systems   Review of Systems  All other systems reviewed and are negative.   Physical Exam Updated Vital Signs BP 136/70 (BP Location: Right Arm)   Pulse 99   Temp 99.6 F (37.6 C) (Oral)   Resp 20   SpO2 100%  Physical Exam Vitals and nursing note reviewed.  Constitutional:      General: She is not in acute distress.    Appearance: She is well-developed. She is obese.  HENT:     Head:  Atraumatic.  Eyes:     Conjunctiva/sclera: Conjunctivae normal.  Cardiovascular:     Rate and Rhythm: Normal rate and regular rhythm.     Pulses: Normal pulses.     Heart sounds: Normal heart sounds.  Pulmonary:     Effort: Pulmonary effort is normal.  Abdominal:     Palpations: Abdomen is soft.     Tenderness: There is no abdominal tenderness.  Genitourinary:    Comments: Chaperone present during exam.  An area of induration noted at the 9 o'clock position to the perianal region at the anal verge exquisitely tender to palpation.  Surrounding skin erythema noted.  Exam is limited due to patient discomfort.   Perineum otherwise soft. Musculoskeletal:     Cervical back: Neck supple.  Skin:    Findings: No rash.  Neurological:     Mental Status: She is alert.  Psychiatric:        Mood and Affect: Mood normal.     ED Results / Procedures / Treatments   Labs (all labs ordered are listed, but only abnormal results are displayed) Labs Reviewed  CBC WITH DIFFERENTIAL/PLATELET - Abnormal; Notable for the following components:      Result Value   RBC 3.78 (*)    HCT 34.5 (*)    Neutro Abs 7.9 (*)    All other components within normal limits  COMPREHENSIVE METABOLIC PANEL WITH GFR - Abnormal; Notable for the following components:   Glucose, Bld 105 (*)    All other components within normal limits  URINALYSIS, ROUTINE W REFLEX MICROSCOPIC - Abnormal; Notable for the following components:   Protein, ur 30 (*)    Leukocytes,Ua TRACE (*)    All other components within normal limits  LIPASE, BLOOD  PREGNANCY, URINE    EKG None  Radiology CT PELVIS W CONTRAST Result Date: 05/08/2023 CLINICAL DATA:  Abdomen pain EXAM: CT PELVIS WITH CONTRAST TECHNIQUE: Multidetector CT imaging of the pelvis was performed using the standard protocol following the bolus administration of intravenous contrast. RADIATION DOSE REDUCTION: This exam was performed according to the departmental dose-optimization program which includes automated exposure control, adjustment of the mA and/or kV according to patient size and/or use of iterative reconstruction technique. CONTRAST:  OMNIPAQUE  IOHEXOL  300 MG/ML  SOLN COMPARISON:  CT 04/24/2021 FINDINGS: Urinary Tract:  No abnormality visualized. Bowel:  Unremarkable visualized pelvic bowel loops. Vascular/Lymphatic: No pathologically enlarged lymph nodes. No significant vascular abnormality seen. Reproductive: Possible small uterine fibroids. No suspicious adnexal mass Other: Negative for pelvic effusion or free air. Skin thickening at the gluteal cleft with mild infiltration  of the subcutaneous fat of the right gluteal fold. Small irregular rim enhancing fluid collection within the right posterior perianal region at approximate 7 o'clock position, this measures 2.3 by 1.7 cm on series 2, image 54. Musculoskeletal: No acute osseous abnormality. IMPRESSION: Skin thickening at the gluteal cleft with mild infiltration of the subcutaneous fat of the right gluteal fold, findings suggestive of cellulitis. Small irregular rim enhancing fluid collection within the right posterior perianal region at approximate 7 o'clock position measuring up to 2.3 cm, suspicious for small perianal abscess. Electronically Signed   By: Esmeralda Hedge M.D.   On: 05/08/2023 19:26    Procedures Procedures    Medications Ordered in ED Medications  clindamycin  (CLEOCIN ) IVPB 600 mg (0 mg Intravenous Stopped 05/08/23 1941)  iohexol  (OMNIPAQUE ) 300 MG/ML solution 100 mL (100 mLs Intravenous Contrast Given 05/08/23 1822)    ED Course/ Medical Decision Making/  A&P                                 Medical Decision Making Amount and/or Complexity of Data Reviewed Labs: ordered. Radiology: ordered.  Risk Prescription drug management.   BP 136/70 (BP Location: Right Arm)   Pulse 99   Temp 99.6 F (37.6 C) (Oral)   Resp 20   SpO2 100%   69:24 PM  33 year old female history of rheumatoid arthritis currently on Plaquenil , PCOS, uterine fibroid, anemia presenting with complaint of rectal pain.  Patient report acute onset of sharp stabbing pain about her rectum that started early this morning and wakes her up from sleep.  Pain is moderate in severity, worse with sitting, palpation and with movement.  She is afraid to have bowel movement due to the discomfort.  She did mention having diarrhea for the past week but denies any constipation and denies straining.  She has not noticed any blood in the stool.  She denies having fever or chills no nausea or vomiting.  She denies any rectal trauma.  No specific  treatment tried.  On exam, patient is well-appearing laying in a right lateral decubitus position.  Chaperone present during exam.  Patient has an area of induration and tenderness to her perirectal region that is quite tender and exam is limited due to her discomfort.  I am concerned that this could be a perianal abscess.  Doubt thrombosed hemorrhoid or rectal prolapse.  No obvious rectal fissure.  No obvious signs concerning for Fournier gangrene.  Workup initiated, will obtain abdominal pelvis CT scan for further assessment.  -Labs ordered, independently viewed and interpreted by me.  Labs remarkable for reassuring labs no signs of systemic infection. -The patient was maintained on a cardiac monitor.  I personally viewed and interpreted the cardiac monitored which showed an underlying rhythm of: Normal sinus rhythm -Imaging independently viewed and interpreted by me and I agree with radiologist's interpretation.  Result remarkable for pelvic CT scan showing signs of cellulitis to the gluteal region with a small perianal abscess measuring up to 2.3 cm. -This patient presents to the ED for concern of rectal pain, this involves an extensive number of treatment options, and is a complaint that carries with it a high risk of complications and morbidity.  The differential diagnosis includes perirectal abscess, cellulitis, Fournier gangrene, colitis, thrombosed hemorrhoid, anal fissure -Co morbidities that complicate the patient evaluation includes rheumatoid arthritis, uterine fibroid, anemia -Treatment includes clindamycin .  I offered incision and drainage of her abscess but patient declined.  I felt abscess is small enough that an antibiotic may be helpful in treating it.  However I gave patient strict return precautions as well as given patient referral to general surgery. -Reevaluation of the patient after these medicines showed that the patient improved -PCP office notes or outside notes  reviewed -Escalation to admission/observation considered: patients feels much better, is comfortable with discharge, and will follow up with PCP -Prescription medication considered, patient comfortable with clindamycin  and ibuprofen  -Social Determinant of Health considered which includes hx of tobacco use         Final Clinical Impression(s) / ED Diagnoses Final diagnoses:  Abscess, perianal    Rx / DC Orders ED Discharge Orders          Ordered    clindamycin  (CLEOCIN ) 300 MG capsule  4 times daily        05/08/23 2101  ibuprofen  (ADVIL ) 600 MG tablet  Every 6 hours PRN        05/08/23 2101              Debbra Fairy, PA-C 05/08/23 2102    Tonya Fredrickson, MD 05/09/23 [id number]

## 2023-05-08 NOTE — Discharge Instructions (Addendum)
 You been diagnosed with having an abscess to the perianal region.  Take antibiotic as prescribed.  Use warm moist compress and apply to the affected area or use sitz bath multiple times a day to aid with healing.  You may follow-up with general surgery for outpatient care.  Please return if your symptoms worsen or if you have other concern.

## 2023-06-01 ENCOUNTER — Other Ambulatory Visit: Payer: Self-pay | Admitting: Internal Medicine

## 2023-06-01 DIAGNOSIS — R768 Other specified abnormal immunological findings in serum: Secondary | ICD-10-CM

## 2023-06-02 NOTE — Telephone Encounter (Signed)
 Last Fill: 02/24/2023  Eye exam: 03/22/2023 WNL   Labs: 05/08/2023 Glucose 105 RBC 3.78 HCT 34.5 Neutro Abs 7.9  Next Visit: 09/05/2023  Last Visit: 03/08/2023  EA:[id number]   Current Dose per office note 03/08/2023: Plaquenil  200mg  daily   Okay to refill Plaquenil ?

## 2023-06-03 MED ORDER — HYDROXYCHLOROQUINE SULFATE 200 MG PO TABS: 200.0000 mg | ORAL_TABLET | Freq: Every day | ORAL | 0 refills | Status: DC

## 2023-08-16 ENCOUNTER — Other Ambulatory Visit: Payer: Self-pay | Admitting: Internal Medicine

## 2023-08-16 DIAGNOSIS — R768 Other specified abnormal immunological findings in serum: Secondary | ICD-10-CM

## 2023-08-17 NOTE — Telephone Encounter (Signed)
 Last Fill: 06/03/2023  Eye exam: 03/22/2023 WNL   Labs: 05/08/2023 Glucose 105 RBC 3.78 HCT 34.5 Neutro Abs 7.9  Next Visit: 09/05/2023  Last Visit: 03/08/2023  DX: Polyarthralgia   Current Dose per office note 03/08/2023: Plaquenil  200mg  daily, Consider discontinuing hydroxychloroquine  if labs normal   Okay to refill Plaquenil ?
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
## 2023-09-05 ENCOUNTER — Ambulatory Visit: Payer: No Typology Code available for payment source | Admitting: Internal Medicine

## 2023-09-05 DIAGNOSIS — M2550 Pain in unspecified joint: Secondary | ICD-10-CM

## 2023-09-05 DIAGNOSIS — Z79899 Other long term (current) drug therapy: Secondary | ICD-10-CM

## 2023-09-05 DIAGNOSIS — R200 Anesthesia of skin: Secondary | ICD-10-CM

## 2023-09-23 ENCOUNTER — Emergency Department (HOSPITAL_BASED_OUTPATIENT_CLINIC_OR_DEPARTMENT_OTHER)

## 2023-09-23 ENCOUNTER — Encounter (HOSPITAL_BASED_OUTPATIENT_CLINIC_OR_DEPARTMENT_OTHER): Payer: Self-pay | Admitting: Urology

## 2023-09-23 ENCOUNTER — Other Ambulatory Visit: Payer: Self-pay

## 2023-09-23 ENCOUNTER — Emergency Department (HOSPITAL_BASED_OUTPATIENT_CLINIC_OR_DEPARTMENT_OTHER)
Admission: EM | Admit: 2023-09-23 | Discharge: 2023-09-23 | Disposition: A | Discharge status: Home / Self Care | Attending: Emergency Medicine | Admitting: Emergency Medicine

## 2023-09-23 DIAGNOSIS — Z91040 Latex allergy status: Secondary | ICD-10-CM | POA: Diagnosis not present

## 2023-09-23 DIAGNOSIS — R109 Unspecified abdominal pain: Secondary | ICD-10-CM | POA: Diagnosis present

## 2023-09-23 DIAGNOSIS — N3000 Acute cystitis without hematuria: Secondary | ICD-10-CM | POA: Diagnosis not present

## 2023-09-23 LAB — COMPREHENSIVE METABOLIC PANEL WITH GFR
ALT: 15 U/L (ref 0–44)
AST: 17 U/L (ref 15–41)
Albumin: 4.2 g/dL (ref 3.5–5.0)
Alkaline Phosphatase: 57 U/L (ref 38–126)
Anion gap: 9 (ref 5–15)
BUN: 10 mg/dL (ref 6–20)
CO2: 26 mmol/L (ref 22–32)
Calcium: 8.9 mg/dL (ref 8.9–10.3)
Chloride: 104 mmol/L (ref 98–111)
Creatinine, Ser: 0.95 mg/dL (ref 0.44–1.00)
GFR, Estimated: >60 mL/min (ref >60)
Glucose, Bld: 129 mg/dL — ABNORMAL HIGH (ref 70–99)
Potassium: 3.8 mmol/L (ref 3.5–5.1)
Sodium: 139 mmol/L (ref 135–145)
Total Bilirubin: 0.4 mg/dL (ref 0.0–1.2)
Total Protein: 6.4 g/dL — ABNORMAL LOW (ref 6.5–8.1)

## 2023-09-23 LAB — CBC WITH DIFFERENTIAL/PLATELET
Abs Immature Granulocytes: 0.01 K/uL (ref 0.00–0.07)
Basophils Absolute: 0 K/uL (ref 0.0–0.1)
Basophils Relative: 1 %
Eosinophils Absolute: 0.2 K/uL (ref 0.0–0.5)
Eosinophils Relative: 3 %
HCT: 35.1 % — ABNORMAL LOW (ref 36.0–46.0)
Hemoglobin: 12.1 g/dL (ref 12.0–15.0)
Immature Granulocytes: 0 %
Lymphocytes Relative: 22 %
Lymphs Abs: 1.4 K/uL (ref 0.7–4.0)
MCH: 30.9 pg (ref 26.0–34.0)
MCHC: 34.5 g/dL (ref 30.0–36.0)
MCV: 89.8 fL (ref 80.0–100.0)
Monocytes Absolute: 0.4 K/uL (ref 0.1–1.0)
Monocytes Relative: 7 %
Neutro Abs: 4.1 K/uL (ref 1.7–7.7)
Neutrophils Relative %: 67 %
Platelets: 220 K/uL (ref 150–400)
RBC: 3.91 MIL/uL (ref 3.87–5.11)
RDW: 13 % (ref 11.5–15.5)
WBC: 6.1 K/uL (ref 4.0–10.5)
nRBC: 0 % (ref 0.0–0.2)

## 2023-09-23 LAB — URINALYSIS, MICROSCOPIC (REFLEX)

## 2023-09-23 LAB — URINALYSIS, ROUTINE W REFLEX MICROSCOPIC
Bilirubin Urine: NEGATIVE
Glucose, UA: 100 mg/dL — AB
Ketones, ur: NEGATIVE mg/dL
Leukocytes,Ua: NEGATIVE
Nitrite: POSITIVE — AB
Protein, ur: NEGATIVE mg/dL
Specific Gravity, Urine: >=1.03 (ref 1.005–1.030)
pH: 6 (ref 5.0–8.0)

## 2023-09-23 LAB — LIPASE, BLOOD: Lipase: 38 U/L (ref 11–51)

## 2023-09-23 LAB — HCG, SERUM, QUALITATIVE: Preg, Serum: NEGATIVE

## 2023-09-23 MED ORDER — CEPHALEXIN 500 MG PO CAPS
500.0000 mg | ORAL_CAPSULE | Freq: Four times a day (QID) | ORAL | 0 refills | Status: DC
Start: 2023-09-23 — End: 2023-10-12

## 2023-09-23 MED ORDER — ONDANSETRON HCL 4 MG PO TABS: 4.0000 mg | ORAL_TABLET | Freq: Four times a day (QID) | ORAL | 0 refills | Status: AC

## 2023-09-23 MED ORDER — ONDANSETRON HCL 4 MG/2ML IJ SOLN
4.0000 mg | Freq: Once | INTRAMUSCULAR | Status: AC
  Administered 2023-09-23: 4 mg via INTRAVENOUS
  Filled 2023-09-23: qty 2

## 2023-09-23 NOTE — ED Provider Notes (Signed)
  EMERGENCY DEPARTMENT AT [facility] Provider Note   CSN: [id number] Arrival date & time: 09/23/23  9095     Patient presents with: Flank Pain   Kathleen Sanders is a 33 y.o. female who presents to the ED today with what she describes as a concerns for a kidney infection.  She has bilateral flank pain that has been worsening over the last week and now endorses radiation into the abdomen.  She does have discomfort with urination and endorses a orange tinge to her urine.  Further noted that she does have a previous history of chronic back pain secondary to a herniated thoracic disc.  She endorses some mild nausea, no vomiting.  Review of previous medical history shows chronic back pain, migraine with aura, RF positive, fibromyalgia.    Flank Pain Associated symptoms include abdominal pain.       Prior to Admission medications   Medication Sig Start Date End Date Taking? Authorizing Provider  cephALEXin  (KEFLEX ) 500 MG capsule Take 1 capsule (500 mg total) by mouth 4 (four) times daily. 09/23/23  Yes Myriam Dorn BROCKS, PA  ondansetron  (ZOFRAN ) 4 MG tablet Take 1 tablet (4 mg total) by mouth every 6 (six) hours. 09/23/23  Yes Myriam Dorn BROCKS, PA  acetaminophen  (TYLENOL ) 325 MG tablet Take 325-650 mg by mouth every 6 (six) hours as needed (for pain/headache.).    [provider]  albuterol  (VENTOLIN  HFA) 108 (90 Base) MCG/ACT inhaler 1 puff every 4 (four) hours. Patient not taking: Reported on 03/08/2023 05/21/22   [provider]  ALPRAZolam (XANAX) 1 MG tablet Take 1 mg by mouth 2 (two) times daily. 02/10/23   [provider]  clindamycin  (CLEOCIN ) 300 MG capsule Take 1 capsule (300 mg total) by mouth 4 (four) times daily. X 7 days 05/08/23   Nivia Colon, PA-C  Cranberry 50 MG CHEW Chew by mouth.    [provider]  DULoxetine (CYMBALTA) 60 MG capsule Take 60 mg by mouth daily.    [provider]  ferrous sulfate 325 (65 FE) MG  EC tablet Take 325 mg by mouth 3 (three) times daily with meals.    [provider]  gabapentin (NEURONTIN) 300 MG capsule Patient takes 2 capsules in the morning and 1 capsule in the afternoon. 11/30/21   [provider]  hydroxychloroquine  (PLAQUENIL ) 200 MG tablet TAKE ONE TABLET BY MOUTH ONE TIME DAILY 08/18/23   Jeannetta Lonni ORN, MD  hydrOXYzine (ATARAX) 25 MG tablet Take 25 mg by mouth 2 (two) times daily. 03/05/23   [provider]  ibuprofen  (ADVIL ) 600 MG tablet Take 1 tablet (600 mg total) by mouth every 6 (six) hours as needed. 05/08/23   Nivia Colon, PA-C  lamoTRIgine (LAMICTAL) 100 MG tablet Take 1 tablet by mouth 2 (two) times daily. Patient not taking: Reported on 03/08/2023 06/29/22   [provider]  Magnesium 300 MG CAPS Take by mouth.    [provider]  mirtazapine (REMERON) 15 MG tablet Take 7.5-15 mg by mouth at bedtime.    [provider]  Probiotic Product (PROBIOTIC BLEND) CAPS Take by mouth.    [provider]    Allergies: Amoxicillin, Medroxyprogesterone , Penicillins, and Latex    Review of Systems  Gastrointestinal:  Positive for abdominal pain.  Genitourinary:  Positive for flank pain.  All other systems reviewed and are negative.   Updated Vital Signs BP 101/77 (BP Location: Right Arm)   Pulse 75   Temp 98.4  F (36.9 C) (Oral)   Resp 18   Ht 5' 6 (1.676 m)   Wt 127.5 kg   SpO2 98%   BMI 45.37 kg/m   Physical Exam Vitals and nursing note reviewed.  Constitutional:      General: She is not in acute distress.    Appearance: Normal appearance.  HENT:     Head: Normocephalic and atraumatic.     Mouth/Throat:     Mouth: Mucous membranes are moist.     Pharynx: Oropharynx is clear.  Eyes:     Extraocular Movements: Extraocular movements intact.     Conjunctiva/sclera: Conjunctivae normal.     Pupils: Pupils are equal, round, and reactive to light.  Cardiovascular:     Rate and Rhythm:  Normal rate and regular rhythm.     Pulses: Normal pulses.     Heart sounds: Normal heart sounds. No murmur heard.    No friction rub. No gallop.  Pulmonary:     Effort: Pulmonary effort is normal.     Breath sounds: Normal breath sounds.  Abdominal:     General: Abdomen is flat. Bowel sounds are normal.     Palpations: Abdomen is soft.     Tenderness: There is abdominal tenderness in the right upper quadrant, right lower quadrant and suprapubic area. There is right CVA tenderness.  Musculoskeletal:        General: Normal range of motion.     Cervical back: Normal range of motion and neck supple.  Skin:    General: Skin is warm and dry.     Capillary Refill: Capillary refill takes less than 2 seconds.  Neurological:     General: No focal deficit present.     Mental Status: She is alert. Mental status is at baseline.  Psychiatric:        Mood and Affect: Mood normal.     (all labs ordered are listed, but only abnormal results are displayed) Labs Reviewed  URINALYSIS, ROUTINE W REFLEX MICROSCOPIC - Abnormal; Notable for the following components:      Result Value   Color, Urine AMBER (*)    Glucose, UA 100 (*)    Hgb urine dipstick MODERATE (*)    Nitrite POSITIVE (*)    All other components within normal limits  COMPREHENSIVE METABOLIC PANEL WITH GFR - Abnormal; Notable for the following components:   Glucose, Bld 129 (*)    Total Protein 6.4 (*)    All other components within normal limits  CBC WITH DIFFERENTIAL/PLATELET - Abnormal; Notable for the following components:   HCT 35.1 (*)    All other components within normal limits  URINALYSIS, MICROSCOPIC (REFLEX) - Abnormal; Notable for the following components:   Bacteria, UA FEW (*)    All other components within normal limits  LIPASE, BLOOD  HCG, SERUM, QUALITATIVE    EKG: None  Radiology: US  Abdomen Limited Result Date: 09/23/2023 CLINICAL DATA:  Right upper quadrant abdominal tenderness. EXAM: ULTRASOUND  ABDOMEN LIMITED RIGHT UPPER QUADRANT COMPARISON:  02/13/2022.  Abdomen and pelvis CT dated 04/24/2021. FINDINGS: Gallbladder: Poorly distended. No gallstones, wall thickening or pericholecystic fluid. No sonographic Murphy sign. Common bile duct: Diameter: 2.0 mm Liver: No focal lesion identified. Within normal limits in parenchymal echogenicity. Portal vein is patent on color Doppler imaging with normal direction of blood flow towards the liver. Other: No free peritoneal fluid. IMPRESSION: Normal examination. Electronically Signed   By: Elspeth Bathe M.D.   On: 09/23/2023 10:50  Procedures   Medications Ordered in the ED  ondansetron  (ZOFRAN ) injection 4 mg (4 mg Intravenous Given 09/23/23 1018)                                    Medical Decision Making Amount and/or Complexity of Data Reviewed Labs: ordered. Radiology: ordered.  Risk Prescription drug management.   Medical Decision Making:   Kathleen Sanders is a 33 y.o. female who presented to the ED today with right-sided lower abdominal pain as well as dysuria and flank pain.  Detailed above.    External chart has been reviewed including previous labs and imaging. Complete initial physical exam performed, notably the patient  was alert and oriented in no apparent distress.  There is right upper quadrant and right lower quadrant tenderness along with positive CVA tenderness to the right..    Reviewed and confirmed nursing documentation for past medical history, family history, social history.    Initial Assessment:   With the patient's presentation of abdominal and flank pain, consider differential diagnosis of acute cystitis/pyelonephritis.  Also given right upper quadrant tenderness and further findings on assessment, consider possible cholecystitis, pancreatic etiology of her condition, hepatobiliary disease, ACS.   Initial Plan:  Obtain limited ultrasound of the right upper quadrant to assess for biliary obstruction. Screening  labs including CBC and Metabolic panel to evaluate for infectious or metabolic etiology of disease.  Include lipase to assess for pancreatic etiology Urinalysis with reflex culture ordered to evaluate for UTI or relevant urologic/nephrologic pathology.  EKG to evaluate for cardiac pathology Objective evaluation as below reviewed   Initial Study Results:   Laboratory  All laboratory results reviewed without evidence of clinically relevant pathology.   Exceptions include: UA does show moderate hemoglobin along with glycosuria and positive nitrates along with the presence of bacteria.  EKG EKG was reviewed independently. Rate, rhythm, axis, intervals all examined and without medically relevant abnormality. ST segments without concerns for elevations.    Radiology:  All images reviewed independently. Agree with radiology report at this time.   US  Abdomen Limited Result Date: 09/23/2023 CLINICAL DATA:  Right upper quadrant abdominal tenderness. EXAM: ULTRASOUND ABDOMEN LIMITED RIGHT UPPER QUADRANT COMPARISON:  02/13/2022.  Abdomen and pelvis CT dated 04/24/2021. FINDINGS: Gallbladder: Poorly distended. No gallstones, wall thickening or pericholecystic fluid. No sonographic Murphy sign. Common bile duct: Diameter: 2.0 mm Liver: No focal lesion identified. Within normal limits in parenchymal echogenicity. Portal vein is patent on color Doppler imaging with normal direction of blood flow towards the liver. Other: No free peritoneal fluid. IMPRESSION: Normal examination. Electronically Signed   By: Elspeth Bathe M.D.   On: 09/23/2023 10:50    Reassessment and Plan:   Ultrasound ruled out cholecystitis or biliary obstruction, and lipase is negative suggesting there is not a pancreatic or hepatobiliary etiology of her condition.  Her UA does show positive for hemoglobin, glucose, nitrites, and bacteria suggesting that she does have a cystitis, possible pyelonephritis.  As such, will begin treatment with  outpatient course of Keflex  and stressed importance of oral liquids.  Refer to primary care for follow-up, careful return precautions given, patient understands agrees has no further concerns at this time.  Vital signs remained stable, and given the lack of serious exam findings, I find she is stable for discharge and outpatient management.       Final diagnoses:  Acute cystitis without hematuria    ED  Discharge Orders          Ordered    cephALEXin  (KEFLEX ) 500 MG capsule  4 times daily        09/23/23 1157    ondansetron  (ZOFRAN ) 4 MG tablet  Every 6 hours        09/23/23 1157               Myriam Dorn BROCKS, [place] 09/23/23 1747    Tegeler, Lonni PARAS, MD 09/24/23 1530

## 2023-09-23 NOTE — ED Triage Notes (Signed)
 Pt states concern for kidney infection, bilateral flank pain x 1 week. Pain now radiating to stomach  Reports dysuria and dark urine color    H/o herniated thoracic disc

## 2023-09-23 NOTE — Discharge Instructions (Signed)
 Also, along with taking this medication I would encourage you to take a probiotic to prevent any nausea or vomiting.  Also prescribed you an antinausea medication that you can take as needed if this medication produces nausea.  Please follow-up with your primary care within 2 weeks to evaluate for continued progress, see us  back here in the ED should you have any further concerns, worsening signs or symptoms.
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
## 2023-10-12 ENCOUNTER — Ambulatory Visit: Attending: Internal Medicine | Admitting: Internal Medicine

## 2023-10-12 ENCOUNTER — Encounter: Payer: Self-pay | Admitting: Internal Medicine

## 2023-10-12 ENCOUNTER — Telehealth: Payer: Self-pay | Admitting: *Deleted

## 2023-10-12 VITALS — BP 121/70 | HR 75 | Temp 97.8°F | Resp 16 | Ht 66.0 in | Wt 271.8 lb

## 2023-10-12 DIAGNOSIS — M2550 Pain in unspecified joint: Secondary | ICD-10-CM

## 2023-10-12 DIAGNOSIS — Z79899 Other long term (current) drug therapy: Secondary | ICD-10-CM | POA: Diagnosis not present

## 2023-10-12 DIAGNOSIS — R200 Anesthesia of skin: Secondary | ICD-10-CM

## 2023-10-12 DIAGNOSIS — R768 Other specified abnormal immunological findings in serum: Secondary | ICD-10-CM

## 2023-10-12 MED ORDER — HYDROXYCHLOROQUINE SULFATE 200 MG PO TABS: 200.0000 mg | ORAL_TABLET | Freq: Every day | ORAL | 1 refills | Status: AC

## 2023-10-12 NOTE — Telephone Encounter (Signed)
-----   Message from Lonni ORN [facility] sent at 10/12/2023 10:52 AM EDT ----- Ms. Leandro needs referral to Dr. Eldonna - [facility] for carpal tunnel syndrome.

## 2023-10-17 ENCOUNTER — Emergency Department (HOSPITAL_BASED_OUTPATIENT_CLINIC_OR_DEPARTMENT_OTHER)
Admission: EM | Admit: 2023-10-17 | Discharge: 2023-10-17 | Disposition: A | Discharge status: Home / Self Care | Attending: Emergency Medicine | Admitting: Emergency Medicine

## 2023-10-17 ENCOUNTER — Emergency Department (HOSPITAL_BASED_OUTPATIENT_CLINIC_OR_DEPARTMENT_OTHER)

## 2023-10-17 ENCOUNTER — Emergency Department (HOSPITAL_COMMUNITY)

## 2023-10-17 ENCOUNTER — Other Ambulatory Visit: Payer: Self-pay

## 2023-10-17 ENCOUNTER — Encounter (HOSPITAL_BASED_OUTPATIENT_CLINIC_OR_DEPARTMENT_OTHER): Payer: Self-pay

## 2023-10-17 DIAGNOSIS — X500XXA Overexertion from strenuous movement or load, initial encounter: Secondary | ICD-10-CM | POA: Diagnosis not present

## 2023-10-17 DIAGNOSIS — M546 Pain in thoracic spine: Secondary | ICD-10-CM | POA: Insufficient documentation

## 2023-10-17 DIAGNOSIS — M545 Low back pain, unspecified: Secondary | ICD-10-CM | POA: Diagnosis not present

## 2023-10-17 DIAGNOSIS — R1084 Generalized abdominal pain: Secondary | ICD-10-CM | POA: Diagnosis not present

## 2023-10-17 DIAGNOSIS — Z91040 Latex allergy status: Secondary | ICD-10-CM | POA: Diagnosis not present

## 2023-10-17 LAB — CBC WITH DIFFERENTIAL/PLATELET
Abs Immature Granulocytes: 0.02 K/uL (ref 0.00–0.07)
Basophils Absolute: 0.1 K/uL (ref 0.0–0.1)
Basophils Relative: 1 %
Eosinophils Absolute: 0.3 K/uL (ref 0.0–0.5)
Eosinophils Relative: 4 %
HCT: 35.4 % — ABNORMAL LOW (ref 36.0–46.0)
Hemoglobin: 12.1 g/dL (ref 12.0–15.0)
Immature Granulocytes: 0 %
Lymphocytes Relative: 30 %
Lymphs Abs: 2 K/uL (ref 0.7–4.0)
MCH: 31.3 pg (ref 26.0–34.0)
MCHC: 34.2 g/dL (ref 30.0–36.0)
MCV: 91.7 fL (ref 80.0–100.0)
Monocytes Absolute: 0.4 K/uL (ref 0.1–1.0)
Monocytes Relative: 7 %
Neutro Abs: 3.9 K/uL (ref 1.7–7.7)
Neutrophils Relative %: 58 %
Platelets: 220 K/uL (ref 150–400)
RBC: 3.86 MIL/uL — ABNORMAL LOW (ref 3.87–5.11)
RDW: 13.1 % (ref 11.5–15.5)
WBC: 6.7 K/uL (ref 4.0–10.5)
nRBC: 0 % (ref 0.0–0.2)

## 2023-10-17 LAB — URINALYSIS, W/ REFLEX TO CULTURE (INFECTION SUSPECTED)
Bacteria, UA: NONE SEEN
Bilirubin Urine: NEGATIVE
Glucose, UA: NEGATIVE mg/dL
Ketones, ur: NEGATIVE mg/dL
Leukocytes,Ua: NEGATIVE
Nitrite: NEGATIVE
Specific Gravity, Urine: 1.024 (ref 1.005–1.030)
pH: 6.5 (ref 5.0–8.0)

## 2023-10-17 LAB — COMPREHENSIVE METABOLIC PANEL WITH GFR
ALT: 17 U/L (ref 0–44)
AST: 21 U/L (ref 15–41)
Albumin: 4.1 g/dL (ref 3.5–5.0)
Alkaline Phosphatase: 55 U/L (ref 38–126)
Anion gap: 11 (ref 5–15)
BUN: 9 mg/dL (ref 6–20)
CO2: 24 mmol/L (ref 22–32)
Calcium: 9 mg/dL (ref 8.9–10.3)
Chloride: 107 mmol/L (ref 98–111)
Creatinine, Ser: 0.94 mg/dL (ref 0.44–1.00)
GFR, Estimated: >60 mL/min (ref >60)
Glucose, Bld: 127 mg/dL — ABNORMAL HIGH (ref 70–99)
Potassium: 4.3 mmol/L (ref 3.5–5.1)
Sodium: 141 mmol/L (ref 135–145)
Total Bilirubin: 0.3 mg/dL (ref 0.0–1.2)
Total Protein: 6.4 g/dL — ABNORMAL LOW (ref 6.5–8.1)

## 2023-10-17 LAB — LIPASE, BLOOD: Lipase: 41 U/L (ref 11–51)

## 2023-10-17 LAB — HCG, SERUM, QUALITATIVE: Preg, Serum: NEGATIVE

## 2023-10-17 MED ORDER — OXYCODONE-ACETAMINOPHEN 5-325 MG PO TABS
1.0000 | ORAL_TABLET | Freq: Once | ORAL | Status: AC
  Administered 2023-10-17: 1 via ORAL

## 2023-10-17 MED ORDER — METHYLPREDNISOLONE 4 MG PO TBPK: ORAL_TABLET | ORAL | 0 refills | Status: DC

## 2023-10-17 MED ORDER — LIDOCAINE 5 % EX PTCH: 1.0000 | MEDICATED_PATCH | CUTANEOUS | 0 refills | Status: AC

## 2023-10-17 MED ORDER — CYCLOBENZAPRINE HCL 5 MG PO TABS
5.0000 mg | ORAL_TABLET | Freq: Once | ORAL | Status: AC
  Administered 2023-10-17: 5 mg via ORAL
  Filled 2023-10-17: qty 1

## 2023-10-17 MED ORDER — GADOBUTROL 1 MMOL/ML IV SOLN
10.0000 mL | Freq: Once | INTRAVENOUS | Status: AC | PRN
Start: 2023-10-17 — End: 2023-10-17
  Administered 2023-10-17: 10 mL via INTRAVENOUS

## 2023-10-17 MED ORDER — OXYCODONE-ACETAMINOPHEN 5-325 MG PO TABS: 1.0000 | ORAL_TABLET | ORAL | 0 refills | Status: AC | PRN

## 2023-10-17 MED ORDER — IOHEXOL 350 MG/ML SOLN
100.0000 mL | Freq: Once | INTRAVENOUS | Status: AC | PRN
  Administered 2023-10-17: 100 mL via INTRAVENOUS

## 2023-10-17 MED ORDER — LORAZEPAM 2 MG/ML IJ SOLN
1.0000 mg | Freq: Once | INTRAMUSCULAR | Status: AC
  Administered 2023-10-17: 1 mg via INTRAVENOUS
  Filled 2023-10-17: qty 1

## 2023-10-17 MED ORDER — LIDOCAINE 5 % EX PTCH
1.0000 | MEDICATED_PATCH | CUTANEOUS | Status: DC
Start: 2023-10-17 — End: 2023-10-18
  Administered 2023-10-17: 1 via TRANSDERMAL
  Filled 2023-10-17: qty 1

## 2023-10-17 MED ORDER — CYCLOBENZAPRINE HCL 10 MG PO TABS: 10.0000 mg | ORAL_TABLET | Freq: Two times a day (BID) | ORAL | 0 refills | Status: DC | PRN

## 2023-10-17 NOTE — ED Provider Notes (Signed)
  EMERGENCY DEPARTMENT AT [facility] Provider Note   CSN: [id number] Arrival date & time: 10/17/23  9082     Patient presents with: Back Pain   Kathleen Sanders is a 33 y.o. female.    Back Pain    33 year old female with medical history significant for rheumatoid arthritis, fibromyalgia, PCOS, pyelonephritis, degenerative disc disease, herniated disc in the thoracic spine presenting to the emergency department with worsening symptoms of thoracic back pain.  The patient states that she has had occasional heavy lifting over the past few weeks.  She has had loose stools for 2 weeks and has had difficulty urinating with some concern for urinary retention.  She endorses worsening numbness and bilateral weakness to the extremities, denies urinary or fecal incontinence or saddle anesthesia.  She denies any recent falls or trauma.  She has had chills and sweats, denies any fevers.  He endorses sharp severe pain radiating from her thoracic spine around to her epigastrium and additionally endorses shooting pain down her legs bilaterally.  Prior to Admission medications   Medication Sig Start Date End Date Taking? Authorizing Provider  acetaminophen  (TYLENOL ) 325 MG tablet Take 325-650 mg by mouth every 6 (six) hours as needed (for pain/headache.).    [provider]  albuterol  (VENTOLIN  HFA) 108 (90 Base) MCG/ACT inhaler 1 puff every 4 (four) hours. Patient not taking: Reported on 10/12/2023 05/21/22   [provider]  ALPRAZolam (XANAX) 1 MG tablet Take 1 mg by mouth 2 (two) times daily. 02/10/23   [provider]  Cranberry 50 MG CHEW Chew by mouth.    [provider]  DULoxetine (CYMBALTA) 60 MG capsule Take 60 mg by mouth daily.    [provider]  ferrous sulfate 325 (65 FE) MG EC tablet Take 325 mg by mouth 3 (three) times daily with meals.    [provider]  gabapentin (NEURONTIN) 300 MG capsule Patient takes 2 capsules in  the morning and 1 capsule in the afternoon. 11/30/21   [provider]  hydroxychloroquine  (PLAQUENIL ) 200 MG tablet Take 1 tablet (200 mg total) by mouth daily. 10/12/23   Jeannetta Lonni ORN, MD  hydrOXYzine (ATARAX) 25 MG tablet Take 25 mg by mouth 2 (two) times daily. 03/05/23   [provider]  ibuprofen  (ADVIL ) 600 MG tablet Take 1 tablet (600 mg total) by mouth every 6 (six) hours as needed. 05/08/23   Nivia Colon, PA-C  lamoTRIgine (LAMICTAL) 100 MG tablet Take 1 tablet by mouth 2 (two) times daily. Patient not taking: Reported on 10/12/2023 06/29/22   [provider]  Magnesium 300 MG CAPS Take by mouth.    [provider]  MELEYA  0.35 MG tablet SMARTSIG:1 Tablet(s) Daily    [provider]  mirtazapine (REMERON) 15 MG tablet Take 7.5-15 mg by mouth at bedtime. Patient not taking: Reported on 10/12/2023    [provider]  ondansetron  (ZOFRAN ) 4 MG tablet Take 1 tablet (4 mg total) by mouth every 6 (six) hours. Patient not taking: Reported on 10/12/2023 09/23/23   Myriam Dorn BROCKS, PA  Probiotic Product (PROBIOTIC BLEND) CAPS Take by mouth.    [provider]    Allergies: Amoxicillin, Medroxyprogesterone , Penicillins, and Latex    Review of Systems  Musculoskeletal:  Positive for back pain.  All other systems reviewed and are negative.   Updated Vital Signs BP (!) 112/58 (BP Location: Right Arm)   Pulse 75   Temp 98.3 F (36.8 C) (Oral)  Resp 18   Ht 5' 6 (1.676 m)   Wt 123.3 kg   LMP 10/14/2023 (Approximate)   SpO2 99%   BMI 43.87 kg/m   Physical Exam Vitals and nursing note reviewed.  Constitutional:      General: She is not in acute distress.    Appearance: She is well-developed.  HENT:     Head: Normocephalic and atraumatic.  Eyes:     Conjunctiva/sclera: Conjunctivae normal.  Cardiovascular:     Rate and Rhythm: Normal rate and regular rhythm.     Heart sounds: No murmur heard. Pulmonary:      Effort: Pulmonary effort is normal. No respiratory distress.     Breath sounds: Normal breath sounds.  Abdominal:     Palpations: Abdomen is soft.     Tenderness: There is generalized abdominal tenderness.  Musculoskeletal:        General: No swelling.     Cervical back: Neck supple.     Comments: Midline thoracic tenderness to palpation, no cervical or lumbar tenderness to palpation.  Positive straight leg raise test bilaterally  Skin:    General: Skin is warm and dry.     Capillary Refill: Capillary refill takes less than 2 seconds.  Neurological:     Mental Status: She is alert.     Comments: 5-5 strength and intact sensation in the bilateral upper extremities.  4-5 strength and intact sensation bilaterally in the bilateral lower extremities.  Psychiatric:        Mood and Affect: Mood normal.     (all labs ordered are listed, but only abnormal results are displayed) Labs Reviewed  URINALYSIS, W/ REFLEX TO CULTURE (INFECTION SUSPECTED) - Abnormal; Notable for the following components:      Result Value   Hgb urine dipstick MODERATE (*)    Protein, ur TRACE (*)    All other components within normal limits  CBC WITH DIFFERENTIAL/PLATELET - Abnormal; Notable for the following components:   RBC 3.86 (*)    HCT 35.4 (*)    All other components within normal limits  COMPREHENSIVE METABOLIC PANEL WITH GFR - Abnormal; Notable for the following components:   Glucose, Bld 127 (*)    Total Protein 6.4 (*)    All other components within normal limits  HCG, SERUM, QUALITATIVE  LIPASE, BLOOD    EKG: None  Radiology: No results found.   Procedures   Medications Ordered in the ED  lidocaine (LIDODERM) 5 % 1 patch (1 patch Transdermal Patch Applied 10/17/23 1408)  oxyCODONE -acetaminophen  (PERCOCET/ROXICET) 5-325 MG per tablet 1 tablet (1 tablet Oral Given 10/17/23 1408)  cyclobenzaprine (FLEXERIL) tablet 5 mg (5 mg Oral Given 10/17/23 1408)  iohexol  (OMNIPAQUE ) 350 MG/ML injection  100 mL (100 mLs Intravenous Contrast Given 10/17/23 1515)                                    Medical Decision Making Amount and/or Complexity of Data Reviewed Labs: ordered. Radiology: ordered.  Risk Prescription drug management.    33 year old female with medical history significant for rheumatoid arthritis, fibromyalgia, PCOS, pyelonephritis, degenerative disc disease, herniated disc in the thoracic spine presenting to the emergency department with worsening symptoms of thoracic back pain.  The patient states that she has had occasional heavy lifting over the past few weeks.  She has had loose stools for 2 weeks and has had difficulty urinating with some concern for urinary retention.  She endorses worsening numbness and bilateral weakness to the extremities, denies urinary or fecal incontinence or saddle anesthesia.  She denies any recent falls or trauma.  She has had chills and sweats, denies any fevers.  He endorses sharp severe pain radiating from her thoracic spine around to her epigastrium and additionally endorses shooting pain down her legs bilaterally.  On arrival, the patient was afebrile, not tachycardic or tachypneic, hemodynamically stable, saturating well on room air.  On exam, the patient had a positive bilateral straight leg raise test, 4-5 strength in the bilateral lower extremities and midline tenderness to palpation of the thoracic spine.  Given her known herniated disc in the thoracic spine, my primary concern is for worsening disc herniation now resulting in neurologic deficit.  Patient with some concern for urinary retention.  Considered cauda equina syndrome.  Also given known thoracic herniated disc feel thoracic spine abnormality as the etiology of the patient's presentation.  Less likely but considered given some abdominal pain in addition to severe back pain, considered aortic dissection.  Patient without fever, no known history of IV drug abuse, lower concern for  epidural abscess, discitis or osteomyelitis.  Patient was administered Norco, Flexeril, lidocaine patch for pain control.  Urinalysis was performed which revealed moderate hemoglobin in the urine , negative for UTI.  CMP generally unremarkable, hCG negative, CBC without leukocytosis or anemia, lipase normal.  CTA dissection study ordered with T and L-spine reformats pending at time of signout.  Plan to follow-up results of CT imaging, patient will likely need emergent MRI imaging given worsening neurologic deficit in the setting of severe thoracic back pain.  Signout given to Dr. Neysa at [phone] with plan to follow-up results of CT imaging, likely plan for transfer ER to ER for MRI imaging.     Final diagnoses:  None    ED Discharge Orders     None          Jerrol Agent, MD 10/17/23 1523

## 2023-10-17 NOTE — Discharge Instructions (Signed)
 Your history, exam, and workup today did not reveal clear evidence of an acute surgical problem in your back.  It did show the degenerative changes and disc bulges as we discussed.  Please use the Lidoderm patches, muscle relaxant, pain medicine, and steroid Dosepak to help with symptoms and please follow-up with the back doctors.  Please rest and stay hydrated otherwise.  If any symptoms change or worsen acutely, please return to the nearest emergency department.

## 2023-10-17 NOTE — ED Notes (Signed)
 Patient transported to CT

## 2023-10-17 NOTE — ED Provider Notes (Signed)
 Patient transferred from [facility] to get MRI to look for concerning etiology of her back pain and some urinary retention.  Anticipate reassessment after imaging to determine disposition.  I did see patient ambulating down the hall to the bathroom.  Patient says that her symptoms have been ongoing for a week or 2 worsening and has had chronic back pain for several years.  If MRI reassuring, dissipate discharge for outpatient follow-up with her back doctor with now this more information of different images to create a more ongoing pain management plan for her back.  9:45 PM MRIs returned without cauda equina or other acute surgical problem.  We went through all the findings together and we feel the patient is safe for discharge home.  We had a shared decision making conversation and agree with plan for prescription of Lidoderm patches, muscle relaxant, pain medicine, and Medrol Dosepak.  She will follow-up with her back doctor.  She no other questions or concerns and will be discharged for outpatient follow-up.   Clinical Impression: 1. Low back pain, unspecified back pain laterality, unspecified chronicity, unspecified whether sciatica present     Disposition: Discharge  Condition: Good  I have discussed the results, Dx and Tx plan with the pt(& family if present). He/she/they expressed understanding and agree(s) with the plan. Discharge instructions discussed at great length. Strict return precautions discussed and pt &/or family have verbalized understanding of the instructions. No further questions at time of discharge.    New Prescriptions   CYCLOBENZAPRINE (FLEXERIL) 10 MG TABLET    Take 1 tablet (10 mg total) by mouth 2 (two) times daily as needed for muscle spasms.   LIDOCAINE (LIDODERM) 5 %    Place 1 patch onto the skin daily. Remove & Discard patch within 12 hours or as directed by MD   METHYLPREDNISOLONE (MEDROL DOSEPAK) 4 MG TBPK TABLET    Please follow the prescriptions on  the Dosepak   OXYCODONE -ACETAMINOPHEN  (PERCOCET/ROXICET) 5-325 MG TABLET    Take 1 tablet by mouth every 4 (four) hours as needed for severe pain (pain score 7-10).    Follow Up: Pa, [facility] [address] [place] [address] [phone]        Vernelle Wisner, Lonni PARAS, MD 10/17/23 2150

## 2023-10-17 NOTE — ED Triage Notes (Signed)
 PT arrived POV from [facility] for MRI.

## 2023-10-17 NOTE — ED Triage Notes (Addendum)
 Back pain and stiffness. Diarrhea x2 weeks. PMH herniated thoracic disc and RA. Intermittent numbness to bilateral legs, worse at night. Headache.

## 2023-10-17 NOTE — ED Provider Notes (Signed)
 Received signout; dispo pending CT scans.  Briefly 33 year old with acute on chronic back pain with known herniated disks having some urinary retention and weakness paresthesia in bilateral legs.  Pain rating towards abdomen.  Plan for CT scans and then transfer for MRI if CT scans negative.  Clinical Course as of 10/17/23 2357  Mon Oct 17, 2023  1626 CT Angio Chest/Abd/Pel for Dissection W and/or Wo Contrast IMPRESSION: 1. No aortic dissection or acute aortic abnormality. No acute abnormality in the chest, abdomen, or pelvis. 2. Hepatosplenomegaly. 3. Suspect persistent right sciatic artery, variant anatomy. 4. Bulbous appearance of the uterus may represent underlying fibroids.   [TY]  1627 CT T-SPINE NO CHARGE IMPRESSION: 1. No acute findings. 2. Minor anterior spurring at multiple levels. No spinal canal stenosis.   Electronically Signed   By: Andrea Gasman M.D.   On: 10/17/2023 16:16   [TY]  1701 CT L-SPINE NO CHARGE IMPRESSION: 1. No acute osseous abnormality. 2. Mild degenerative disc disease and facet hypertrophy in the lower lumbar spine. 3. Right paracentral disc protrusion at L4-L5 causes mild mass effect on the neural foramen.   Electronically Signed   By: Andrea Gasman M.D.   [TY]  [phone] Patient continues to have pain and symptoms.  Scans negative.  Will try to arrange transfer to [facility] for MRI. [TY]  1741 Accepted by Dr. Cleotis for transfer to [facility] for MRI.  [TY]    Clinical Course User Index [TY] Neysa Caron PARAS, DO      Neysa Caron PARAS, DO 10/17/23 2357

## 2023-10-17 NOTE — ED Notes (Addendum)
 error

## 2023-11-02 ENCOUNTER — Ambulatory Visit: Admitting: Radiology

## 2023-11-02 ENCOUNTER — Ambulatory Visit
Admission: RE | Admit: 2023-11-02 | Discharge: 2023-11-02 | Disposition: A | Discharge status: Home / Self Care | Source: Ambulatory Visit

## 2023-11-02 VITALS — BP 109/73 | HR 82 | Temp 98.1°F | Resp 17

## 2023-11-02 DIAGNOSIS — B349 Viral infection, unspecified: Secondary | ICD-10-CM | POA: Diagnosis not present

## 2023-11-02 DIAGNOSIS — R0989 Other specified symptoms and signs involving the circulatory and respiratory systems: Secondary | ICD-10-CM

## 2023-11-02 LAB — POC COVID19/FLU A&B COMBO
Covid Antigen, POC: NEGATIVE
Influenza A Antigen, POC: NEGATIVE
Influenza B Antigen, POC: NEGATIVE

## 2023-11-02 MED ORDER — AZELASTINE HCL 0.1 % NA SOLN: 1.0000 | Freq: Two times a day (BID) | NASAL | 1 refills | Status: AC

## 2023-11-02 MED ORDER — PROMETHAZINE-DM 6.25-15 MG/5ML PO SYRP: 10.0000 mL | ORAL_SOLUTION | Freq: Three times a day (TID) | ORAL | 0 refills | Status: AC | PRN

## 2023-11-02 NOTE — Discharge Instructions (Signed)
  1. Acute viral syndrome (Primary) - POC Covid19/Flu A&B Antigen complete in UC is negative for COVID and influenza - azelastine (ASTELIN) 0.1 % nasal spray; Place 1 spray into both nostrils 2 (two) times daily. Use in each nostril as directed  Dispense: 30 mL; Refill: 1 - promethazine-dextromethorphan (PROMETHAZINE-DM) 6.25-15 MG/5ML syrup; Take 10 mLs by mouth 3 (three) times daily as needed for cough.  Dispense: 240 mL; Refill: 0 - DG Chest 2 View completed in UC shows no acute cardiopulmonary processes, no sign of consolidation or pneumonia. -Continue to monitor symptoms for any change in severity if there is any escalation of current symptoms or development of new symptoms follow-up in ER for further evaluation and management.

## 2023-11-02 NOTE — ED Provider Notes (Signed)
 [facility]  Note:  This document was prepared using Dragon voice recognition software and may include unintentional dictation errors.  MRN: [id number] DOB: 1990-12-13  Subjective:   Kathleen Sanders is a 33 y.o. female presenting for nasal congestion, scratchy throat, cough, body aches, headache, mild dizziness with change of position, increased difficulty breathing when lying down.  Patient reports that symptoms began last Thursday and have progressed since the weekend.  Patient states that her father has been sick with similar symptoms prior to the onset of hers and prior to that her sister and brother-in-law were sick with COVID.  Patient has not done any home COVID testing prior to coming to urgent care today.  Patient reports that sore throat has improved but she is concerned for chest congestion due to mild dyspnea with laying down.  Patient has past history of asthma, rheumatoid arthritis, and PCOS.  No current facility-administered medications for this encounter.  Current Outpatient Medications:    azelastine (ASTELIN) 0.1 % nasal spray, Place 1 spray into both nostrils 2 (two) times daily. Use in each nostril as directed, Disp: 30 mL, Rfl: 1   promethazine-dextromethorphan (PROMETHAZINE-DM) 6.25-15 MG/5ML syrup, Take 10 mLs by mouth 3 (three) times daily as needed for cough., Disp: 240 mL, Rfl: 0   acetaminophen  (TYLENOL ) 325 MG tablet, Take 325-650 mg by mouth every 6 (six) hours as needed (for pain/headache.)., Disp: , Rfl:    albuterol  (VENTOLIN  HFA) 108 (90 Base) MCG/ACT inhaler, 1 puff every 4 (four) hours. (Patient not taking: Reported on 10/12/2023), Disp: , Rfl:    ALPRAZolam (XANAX) 1 MG tablet, Take 1 mg by mouth 2 (two) times daily., Disp: , Rfl:    Cranberry 50 MG CHEW, Chew by mouth., Disp: , Rfl:    cyclobenzaprine (FLEXERIL) 10 MG tablet, Take 1 tablet (10 mg total) by mouth 2 (two) times daily as needed for muscle spasms., Disp: 20 tablet, Rfl: 0    DULoxetine (CYMBALTA) 60 MG capsule, Take 60 mg by mouth daily., Disp: , Rfl:    ferrous sulfate 325 (65 FE) MG EC tablet, Take 325 mg by mouth 3 (three) times daily with meals., Disp: , Rfl:    gabapentin (NEURONTIN) 300 MG capsule, Patient takes 2 capsules in the morning and 1 capsule in the afternoon., Disp: , Rfl:    hydroxychloroquine  (PLAQUENIL ) 200 MG tablet, Take 1 tablet (200 mg total) by mouth daily., Disp: 90 tablet, Rfl: 1   hydrOXYzine (ATARAX) 25 MG tablet, Take 25 mg by mouth 2 (two) times daily., Disp: , Rfl:    ibuprofen  (ADVIL ) 600 MG tablet, Take 1 tablet (600 mg total) by mouth every 6 (six) hours as needed., Disp: 30 tablet, Rfl: 0   lamoTRIgine (LAMICTAL) 100 MG tablet, Take 1 tablet by mouth 2 (two) times daily. (Patient not taking: Reported on 10/12/2023), Disp: , Rfl:    lidocaine (LIDODERM) 5 %, Place 1 patch onto the skin daily. Remove & Discard patch within 12 hours or as directed by MD, Disp: 15 patch, Rfl: 0   Magnesium 300 MG CAPS, Take by mouth., Disp: , Rfl:    MELEYA  0.35 MG tablet, SMARTSIG:1 Tablet(s) Daily, Disp: , Rfl:    methylPREDNISolone (MEDROL DOSEPAK) 4 MG TBPK tablet, Please follow the prescriptions on the Dosepak, Disp: 21 each, Rfl: 0   mirtazapine (REMERON) 15 MG tablet, Take 7.5-15 mg by mouth at bedtime. (Patient not taking: Reported on 10/12/2023), Disp: , Rfl:    ondansetron  (ZOFRAN ) 4 MG tablet,  Take 1 tablet (4 mg total) by mouth every 6 (six) hours. (Patient not taking: Reported on 10/12/2023), Disp: 12 tablet, Rfl: 0   oxyCODONE -acetaminophen  (PERCOCET/ROXICET) 5-325 MG tablet, Take 1 tablet by mouth every 4 (four) hours as needed for severe pain (pain score 7-10)., Disp: 15 tablet, Rfl: 0   Probiotic Product (PROBIOTIC BLEND) CAPS, Take by mouth., Disp: , Rfl:    Allergies  Allergen Reactions   Amoxicillin Nausea And Vomiting    Has patient had a PCN reaction causing immediate rash, facial/tongue/throat swelling, SOB or lightheadedness with  hypotension: No Has patient had a PCN reaction causing severe rash involving mucus membranes or skin necrosis: No Has patient had a PCN reaction that required hospitalization: No Has patient had a PCN reaction occurring within the last 10 years: Unknown If all of the above answers are NO, then may proceed with Cephalosporin use.    Medroxyprogesterone  Other (See Comments)   Penicillins Nausea And Vomiting    Has patient had a PCN reaction causing immediate rash, facial/tongue/throat swelling, SOB or lightheadedness with hypotension: No Has patient had a PCN reaction causing severe rash involving mucus membranes or skin necrosis: No Has patient had a PCN reaction that required hospitalization: No Has patient had a PCN reaction occurring within the last 10 years: Unknown If all of the above answers are NO, then may proceed with Cephalosporin use.     Latex Rash, Swelling and Hives    Past Medical History:  Diagnosis Date   Anemia    Asthma    seasonal   Degenerative disc disease, lumbar    Fibroid    Fibromyalgia    HNP (herniated nucleus pulposus), thoracic    Hx of chronic pyelonephritis    Migraine with aura    PCOS (polycystic ovarian syndrome)    Pyelonephritis    Rheumatoid arthritis (HCC)    UTI (urinary tract infection)      Past Surgical History:  Procedure Laterality Date   CESAREAN SECTION N/A 08/31/2016   Procedure: CESAREAN SECTION;  Surgeon: Okey Leader, MD;  Location: [facility];  Service: Obstetrics;  Laterality: N/A;    Family History  Problem Relation Age of Onset   Heart disease Mother        heart attacks, had triple bipass   Hypertension Mother    Peripheral Artery Disease Mother    Hypertension Father    Prostate cancer Father    Colon cancer Maternal Grandmother    Colon cancer Paternal Grandfather    Prostate cancer Paternal Grandfather        also lung cancer    Social History   Tobacco Use   Smoking status: Former     Current packs/day: 0.00    Average packs/day: 0.1 packs/day for 2.0 years (0.2 ttl pk-yrs)    Types: Cigarettes    Start date: 05/19/2010    Quit date: 05/18/2012    Years since quitting: 11.4    Passive exposure: Past   Smokeless tobacco: Never  Vaping Use   Vaping status: Some Days   Devices: Socially  Substance Use Topics   Alcohol use: Yes    Comment: socially   Drug use: Never    ROS Refer to HPI for ROS details.  Objective:   Vitals: BP 109/73 (BP Location: Right Arm)   Pulse 82   Temp 98.1 F (36.7 C) (Oral)   Resp 17   LMP 10/14/2023 (Approximate)   SpO2 97%   Physical Exam Vitals and nursing  note reviewed.  Constitutional:      General: She is not in acute distress.    Appearance: Normal appearance. She is well-developed. She is not ill-appearing or toxic-appearing.  HENT:     Head: Normocephalic and atraumatic.     Nose: Congestion present. No rhinorrhea.     Mouth/Throat:     Mouth: Mucous membranes are moist.     Pharynx: Posterior oropharyngeal erythema present.  Eyes:     General:        Right eye: No discharge.        Left eye: No discharge.     Extraocular Movements: Extraocular movements intact.     Conjunctiva/sclera: Conjunctivae normal.  Cardiovascular:     Rate and Rhythm: Normal rate.  Pulmonary:     Effort: Pulmonary effort is normal. No respiratory distress.     Breath sounds: No stridor. No wheezing.  Skin:    General: Skin is warm and dry.  Neurological:     General: No focal deficit present.     Mental Status: She is alert and oriented to person, place, and time.  Psychiatric:        Mood and Affect: Mood normal.        Behavior: Behavior normal.     Procedures  Results for orders placed or performed during the hospital encounter of 11/02/23 (from the past 24 hours)  POC Covid19/Flu A&B Antigen     Status: None   Collection Time: 11/02/23 12:51 PM  Result Value Ref Range   Influenza A Antigen, POC Negative Negative    Influenza B Antigen, POC Negative Negative   Covid Antigen, POC Negative Negative    DG Chest 2 View Result Date: 11/02/2023 CLINICAL DATA:  Chest congestion. EXAM: CHEST - 2 VIEW COMPARISON:  12/22/2022 FINDINGS: The heart size and mediastinal contours are within normal limits. Both lungs are clear. The visualized skeletal structures are unremarkable. IMPRESSION: No active cardiopulmonary disease. Electronically Signed   By: Camellia Candle M.D.   On: 11/02/2023 13:14     Assessment and Plan :     Discharge Instructions       1. Acute viral syndrome (Primary) - POC Covid19/Flu A&B Antigen complete in UC is negative for COVID and influenza - azelastine (ASTELIN) 0.1 % nasal spray; Place 1 spray into both nostrils 2 (two) times daily. Use in each nostril as directed  Dispense: 30 mL; Refill: 1 - promethazine-dextromethorphan (PROMETHAZINE-DM) 6.25-15 MG/5ML syrup; Take 10 mLs by mouth 3 (three) times daily as needed for cough.  Dispense: 240 mL; Refill: 0 - DG Chest 2 View completed in UC shows no acute cardiopulmonary processes, no sign of consolidation or pneumonia. -Continue to monitor symptoms for any change in severity if there is any escalation of current symptoms or development of new symptoms follow-up in ER for further evaluation and management.      Contrina Orona B Armanie Ullmer   Dylen Mcelhannon B, NP 11/02/23 1320

## 2023-11-02 NOTE — ED Triage Notes (Addendum)
 Pt c/o congestion, sore throat, coughing,  difficulty breathing when laying down, body aches, headache, and some dizziness when bending or going from sitting to standing. Symptoms began Thursday.   States sore throat has improved and chest congestion is what is bothering her the most now.

## 2023-11-08 ENCOUNTER — Telehealth: Payer: Self-pay | Admitting: Physical Medicine and Rehabilitation

## 2023-11-08 ENCOUNTER — Encounter: Admitting: Physical Medicine and Rehabilitation

## 2023-11-08 NOTE — Telephone Encounter (Signed)
 Pt called wanting to cancel her appt today at 9:30 due to time restraints at work and will call back to reschedule

## 2023-11-21 ENCOUNTER — Encounter: Payer: Self-pay | Admitting: Radiology

## 2023-12-23 ENCOUNTER — Ambulatory Visit
Admission: EM | Admit: 2023-12-23 | Discharge: 2023-12-23 | Disposition: A | Discharge status: Home / Self Care | Attending: Family Medicine | Admitting: Family Medicine

## 2023-12-23 DIAGNOSIS — G8929 Other chronic pain: Secondary | ICD-10-CM | POA: Diagnosis not present

## 2023-12-23 DIAGNOSIS — M542 Cervicalgia: Secondary | ICD-10-CM | POA: Diagnosis not present

## 2023-12-23 DIAGNOSIS — G44209 Tension-type headache, unspecified, not intractable: Secondary | ICD-10-CM | POA: Diagnosis not present

## 2023-12-23 MED ORDER — DEXAMETHASONE SOD PHOSPHATE PF 10 MG/ML IJ SOLN
10.0000 mg | Freq: Once | INTRAMUSCULAR | Status: AC
  Administered 2023-12-23: 10 mg via INTRAMUSCULAR

## 2023-12-23 MED ORDER — KETOROLAC TROMETHAMINE 30 MG/ML IJ SOLN
30.0000 mg | Freq: Once | INTRAMUSCULAR | Status: AC
  Administered 2023-12-23: 30 mg via INTRAMUSCULAR

## 2023-12-23 NOTE — ED Triage Notes (Signed)
 Pt c/o HA x 4 days-states she is taking multiple OTC pain meds w/o relief-NAD-steady gait

## 2023-12-23 NOTE — ED Provider Notes (Signed)
 [facility]  Note:  This document was prepared using Conservation officer, historic buildings and may include unintentional dictation errors.  MRN: [id number] DOB: 1990/10/27  Subjective:   Kathleen Sanders is a 33 y.o. female presenting for 40 history of persistent moderate to severe frontal headache that radiates along the crown of her head toward her neck.  Has had some mild photophobia.  Has a history of migraines.  Typically gets 1-2 a year.  However, feels that this 1 is slightly different.  Has been getting management of chronic neck pain.  Has been found to have degenerative disc disease, bulging disks at different portions of her neck from her cervical through the lumbar regions.  Has been working with her rheumatologist on this.  Is supposed to go see a spine specialist.  No fevers.  No current facility-administered medications for this encounter.  Current Outpatient Medications:    acetaminophen  (TYLENOL ) 325 MG tablet, Take 325-650 mg by mouth every 6 (six) hours as needed (for pain/headache.)., Disp: , Rfl:    albuterol  (VENTOLIN  HFA) 108 (90 Base) MCG/ACT inhaler, 1 puff every 4 (four) hours. (Patient not taking: Reported on 10/12/2023), Disp: , Rfl:    ALPRAZolam (XANAX) 1 MG tablet, Take 1 mg by mouth 2 (two) times daily., Disp: , Rfl:    azelastine  (ASTELIN ) 0.1 % nasal spray, Place 1 spray into both nostrils 2 (two) times daily. Use in each nostril as directed, Disp: 30 mL, Rfl: 1   Cranberry 50 MG CHEW, Chew by mouth., Disp: , Rfl:    cyclobenzaprine  (FLEXERIL ) 10 MG tablet, Take 1 tablet (10 mg total) by mouth 2 (two) times daily as needed for muscle spasms., Disp: 20 tablet, Rfl: 0   DULoxetine (CYMBALTA) 60 MG capsule, Take 60 mg by mouth daily., Disp: , Rfl:    ferrous sulfate 325 (65 FE) MG EC tablet, Take 325 mg by mouth 3 (three) times daily with meals., Disp: , Rfl:    gabapentin (NEURONTIN) 300 MG capsule, Patient takes 2 capsules in the morning and 1  capsule in the afternoon., Disp: , Rfl:    hydroxychloroquine  (PLAQUENIL ) 200 MG tablet, Take 1 tablet (200 mg total) by mouth daily., Disp: 90 tablet, Rfl: 1   hydrOXYzine (ATARAX) 25 MG tablet, Take 25 mg by mouth 2 (two) times daily., Disp: , Rfl:    ibuprofen  (ADVIL ) 600 MG tablet, Take 1 tablet (600 mg total) by mouth every 6 (six) hours as needed., Disp: 30 tablet, Rfl: 0   lamoTRIgine (LAMICTAL) 100 MG tablet, Take 1 tablet by mouth 2 (two) times daily. (Patient not taking: Reported on 10/12/2023), Disp: , Rfl:    lidocaine  (LIDODERM ) 5 %, Place 1 patch onto the skin daily. Remove & Discard patch within 12 hours or as directed by MD, Disp: 15 patch, Rfl: 0   Magnesium 300 MG CAPS, Take by mouth., Disp: , Rfl:    MELEYA  0.35 MG tablet, SMARTSIG:1 Tablet(s) Daily, Disp: , Rfl:    methylPREDNISolone  (MEDROL  DOSEPAK) 4 MG TBPK tablet, Please follow the prescriptions on the Dosepak, Disp: 21 each, Rfl: 0   mirtazapine (REMERON) 15 MG tablet, Take 7.5-15 mg by mouth at bedtime. (Patient not taking: Reported on 10/12/2023), Disp: , Rfl:    ondansetron  (ZOFRAN ) 4 MG tablet, Take 1 tablet (4 mg total) by mouth every 6 (six) hours. (Patient not taking: Reported on 10/12/2023), Disp: 12 tablet, Rfl: 0   oxyCODONE -acetaminophen  (PERCOCET/ROXICET) 5-325 MG tablet, Take 1 tablet by mouth  every 4 (four) hours as needed for severe pain (pain score 7-10)., Disp: 15 tablet, Rfl: 0   Probiotic Product (PROBIOTIC BLEND) CAPS, Take by mouth., Disp: , Rfl:    promethazine -dextromethorphan (PROMETHAZINE -DM) 6.25-15 MG/5ML syrup, Take 10 mLs by mouth 3 (three) times daily as needed for cough., Disp: 240 mL, Rfl: 0   Allergies  Allergen Reactions   Amoxicillin Nausea And Vomiting    Has patient had a PCN reaction causing immediate rash, facial/tongue/throat swelling, SOB or lightheadedness with hypotension: No Has patient had a PCN reaction causing severe rash involving mucus membranes or skin necrosis: No Has  patient had a PCN reaction that required hospitalization: No Has patient had a PCN reaction occurring within the last 10 years: Unknown If all of the above answers are NO, then may proceed with Cephalosporin use.    Medroxyprogesterone  Other (See Comments)   Penicillins Nausea And Vomiting    Has patient had a PCN reaction causing immediate rash, facial/tongue/throat swelling, SOB or lightheadedness with hypotension: No Has patient had a PCN reaction causing severe rash involving mucus membranes or skin necrosis: No Has patient had a PCN reaction that required hospitalization: No Has patient had a PCN reaction occurring within the last 10 years: Unknown If all of the above answers are NO, then may proceed with Cephalosporin use.     Latex Rash, Swelling and Hives    Past Medical History:  Diagnosis Date   Anemia    Asthma    seasonal   Degenerative disc disease, lumbar    Fibroid    Fibromyalgia    HNP (herniated nucleus pulposus), thoracic    Hx of chronic pyelonephritis    Migraine with aura    PCOS (polycystic ovarian syndrome)    Pyelonephritis    Rheumatoid arthritis (HCC)    UTI (urinary tract infection)      Past Surgical History:  Procedure Laterality Date   CESAREAN SECTION N/A 08/31/2016   Procedure: CESAREAN SECTION;  Surgeon: Okey Leader, MD;  Location: [facility]ING SUIT[facility];  Service: Obstetrics;  Laterality: N/A;    Family History  Problem Relation Age of Onset   Heart disease Mother        heart attacks, had triple bipass   Hypertension Mother    Peripheral Artery Disease Mother    Hypertension Father    Prostate cancer Father    Colon cancer Maternal Grandmother    Colon cancer Paternal Grandfather    Prostate cancer Paternal Grandfather        also lung cancer    Social History   Tobacco Use   Smoking status: Former    Current packs/day: 0.00    Average packs/day: 0.1 packs/day for 2.0 years (0.2 ttl pk-yrs)    Types: Cigarettes    Start  date: 05/19/2010    Quit date: 05/18/2012    Years since quitting: 11.6    Passive exposure: Past   Smokeless tobacco: Never  Vaping Use   Vaping status: Former   Devices: Socially  Substance Use Topics   Alcohol use: Yes    Comment: occ   Drug use: Never    ROS   Objective:   Vitals: BP 121/78 (BP Location: Right Arm)   Pulse 82   Temp 98.8 F (37.1 C) (Oral)   Resp 16   LMP 11/29/2023   SpO2 98%   Physical Exam Constitutional:      General: She is not in acute distress.    Appearance: Normal appearance. She  is well-developed. She is not ill-appearing, toxic-appearing or diaphoretic.  HENT:     Head: Normocephalic and atraumatic.     Nose: Nose normal.     Mouth/Throat:     Mouth: Mucous membranes are moist.  Eyes:     General: Lids are normal. Lids are everted, no foreign bodies appreciated. Vision grossly intact. No scleral icterus.       Right eye: No foreign body, discharge or hordeolum.        Left eye: No foreign body, discharge or hordeolum.     Extraocular Movements: Extraocular movements intact.     Right eye: Normal extraocular motion.     Left eye: Normal extraocular motion and no nystagmus.     Conjunctiva/sclera:     Right eye: Right conjunctiva is not injected. No chemosis, exudate or hemorrhage.    Left eye: Left conjunctiva is not injected. No chemosis, exudate or hemorrhage.    Pupils: Pupils are equal, round, and reactive to light.  Neck:     Meningeal: Brudzinski's sign and Kernig's sign absent.  Cardiovascular:     Rate and Rhythm: Normal rate.  Pulmonary:     Effort: Pulmonary effort is normal.  Skin:    General: Skin is warm and dry.  Neurological:     General: No focal deficit present.     Mental Status: She is alert and oriented to person, place, and time.     Cranial Nerves: No cranial nerve deficit, dysarthria or facial asymmetry.     Motor: No weakness or pronator drift.     Coordination: Romberg sign negative. Coordination normal.  Finger-Nose-Finger Test and Heel to [facility] Test normal. Rapid alternating movements normal.     Gait: Gait and tandem walk normal.     Deep Tendon Reflexes: Reflexes normal.  Psychiatric:        Mood and Affect: Mood normal.        Behavior: Behavior normal.        Thought Content: Thought content normal.        Judgment: Judgment normal.    IM Toradol  30 mg and IM dexamethasone  10 mg administered in clinic.  Assessment and Plan :   PDMP not reviewed this encounter.  1. Tension headache   2. Chronic neck pain    Patient has a moderate tension headache with migraine elements.  Recommended the above interventions.  No signs of an acute encephalopathy.  Will defer ER visit for now.  However, recommended patient present to the emergency room should she develop fever and worsening pain.  Otherwise follow-up with her rheumatologist, consider referral to a spine specialty practice.  Counseled patient on potential for adverse effects with medications prescribed/recommended today, ER and return-to-clinic precautions discussed, patient verbalized understanding.    Christopher Savannah, [place] 12/23/23 [phone]

## 2024-01-18 ENCOUNTER — Emergency Department (HOSPITAL_BASED_OUTPATIENT_CLINIC_OR_DEPARTMENT_OTHER)

## 2024-01-18 ENCOUNTER — Encounter (HOSPITAL_BASED_OUTPATIENT_CLINIC_OR_DEPARTMENT_OTHER): Payer: Self-pay | Admitting: Emergency Medicine

## 2024-01-18 ENCOUNTER — Emergency Department (HOSPITAL_BASED_OUTPATIENT_CLINIC_OR_DEPARTMENT_OTHER)
Admission: EM | Admit: 2024-01-18 | Discharge: 2024-01-18 | Disposition: A | Discharge status: Home / Self Care | Attending: Emergency Medicine | Admitting: Emergency Medicine

## 2024-01-18 ENCOUNTER — Other Ambulatory Visit: Payer: Self-pay

## 2024-01-18 DIAGNOSIS — N3000 Acute cystitis without hematuria: Secondary | ICD-10-CM | POA: Insufficient documentation

## 2024-01-18 DIAGNOSIS — J45909 Unspecified asthma, uncomplicated: Secondary | ICD-10-CM | POA: Diagnosis not present

## 2024-01-18 DIAGNOSIS — Z91040 Latex allergy status: Secondary | ICD-10-CM | POA: Diagnosis not present

## 2024-01-18 DIAGNOSIS — R109 Unspecified abdominal pain: Secondary | ICD-10-CM

## 2024-01-18 DIAGNOSIS — R1010 Upper abdominal pain, unspecified: Secondary | ICD-10-CM | POA: Diagnosis present

## 2024-01-18 LAB — CBC
HCT: 36.8 % (ref 36.0–46.0)
Hemoglobin: 12.6 g/dL (ref 12.0–15.0)
MCH: 30.8 pg (ref 26.0–34.0)
MCHC: 34.2 g/dL (ref 30.0–36.0)
MCV: 90 fL (ref 80.0–100.0)
Platelets: 232 K/uL (ref 150–400)
RBC: 4.09 MIL/uL (ref 3.87–5.11)
RDW: 13.2 % (ref 11.5–15.5)
WBC: 6.2 K/uL (ref 4.0–10.5)
nRBC: 0 % (ref 0.0–0.2)

## 2024-01-18 LAB — URINALYSIS, MICROSCOPIC (REFLEX)

## 2024-01-18 LAB — COMPREHENSIVE METABOLIC PANEL WITH GFR
ALT: 19 U/L (ref 0–44)
AST: 18 U/L (ref 15–41)
Albumin: 4.1 g/dL (ref 3.5–5.0)
Alkaline Phosphatase: 60 U/L (ref 38–126)
Anion gap: 11 (ref 5–15)
BUN: 9 mg/dL (ref 6–20)
CO2: 25 mmol/L (ref 22–32)
Calcium: 9.4 mg/dL (ref 8.9–10.3)
Chloride: 102 mmol/L (ref 98–111)
Creatinine, Ser: 0.82 mg/dL (ref 0.44–1.00)
GFR, Estimated: >60 mL/min
Glucose, Bld: 163 mg/dL — ABNORMAL HIGH (ref 70–99)
Potassium: 3.8 mmol/L (ref 3.5–5.1)
Sodium: 138 mmol/L (ref 135–145)
Total Bilirubin: 0.4 mg/dL (ref 0.0–1.2)
Total Protein: 6.5 g/dL (ref 6.5–8.1)

## 2024-01-18 LAB — URINALYSIS, ROUTINE W REFLEX MICROSCOPIC
Bilirubin Urine: NEGATIVE
Glucose, UA: NEGATIVE mg/dL
Hgb urine dipstick: NEGATIVE
Ketones, ur: NEGATIVE mg/dL
Nitrite: NEGATIVE
Protein, ur: NEGATIVE mg/dL
Specific Gravity, Urine: 1.02 (ref 1.005–1.030)
pH: 6 (ref 5.0–8.0)

## 2024-01-18 LAB — LIPASE, BLOOD: Lipase: 49 U/L (ref 11–51)

## 2024-01-18 LAB — PREGNANCY, URINE: Preg Test, Ur: NEGATIVE

## 2024-01-18 MED ORDER — NAPROXEN 375 MG PO TABS: 375.0000 mg | ORAL_TABLET | Freq: Two times a day (BID) | ORAL | 0 refills | Status: AC

## 2024-01-18 MED ORDER — CEPHALEXIN 500 MG PO CAPS: 500.0000 mg | ORAL_CAPSULE | Freq: Four times a day (QID) | ORAL | 0 refills | Status: AC

## 2024-01-18 MED ORDER — KETOROLAC TROMETHAMINE 15 MG/ML IJ SOLN
15.0000 mg | Freq: Once | INTRAMUSCULAR | Status: AC
  Administered 2024-01-18: 15 mg via INTRAVENOUS
  Filled 2024-01-18: qty 1

## 2024-01-18 MED ORDER — IOHEXOL 350 MG/ML SOLN
100.0000 mL | Freq: Once | INTRAVENOUS | Status: AC | PRN
  Administered 2024-01-18: 75 mL via INTRAVENOUS

## 2024-01-18 NOTE — ED Provider Notes (Signed)
 " [place] [facility] AT [facility] Provider Note   CSN: [id number] Arrival date & time: 01/18/24  9452     Patient presents with: Abdominal Pain   Kathleen Sanders is a 33 y.o. female.    Abdominal Pain    Patient has a history of asthma, migraines, pyelonephritis, polycystic ovarian disease, herniated disc, rheumatoid arthritis, fibromyalgia.  Patient states she has been having symptoms ongoing for couple of weeks.  Patient states she is having pain in her upper abdomen but it goes up and towards her chest.  Increases with deep breathing.  She has not had any trouble with nausea vomiting.  No fevers or chills.  No urinary symptoms.  Prior to Admission medications  Medication Sig Start Date End Date Taking? Authorizing Provider  cephALEXin  (KEFLEX ) 500 MG capsule Take 1 capsule (500 mg total) by mouth 4 (four) times daily. 01/18/24  Yes Randol Simmonds, MD  naproxen (NAPROSYN) 375 MG tablet Take 1 tablet (375 mg total) by mouth 2 (two) times daily. 01/18/24  Yes Randol Simmonds, MD  acetaminophen  (TYLENOL ) 325 MG tablet Take 325-650 mg by mouth every 6 (six) hours as needed (for pain/headache.).    [provider]  albuterol  (VENTOLIN  HFA) 108 (90 Base) MCG/ACT inhaler 1 puff every 4 (four) hours. Patient not taking: Reported on 10/12/2023 05/21/22   [provider]  ALPRAZolam (XANAX) 1 MG tablet Take 1 mg by mouth 2 (two) times daily. 02/10/23   [provider]  azelastine  (ASTELIN ) 0.1 % nasal spray Place 1 spray into both nostrils 2 (two) times daily. Use in each nostril as directed 11/02/23   Reddick, Johnathan B, NP  Cranberry 50 MG CHEW Chew by mouth.    [provider]  cyclobenzaprine  (FLEXERIL ) 10 MG tablet Take 1 tablet (10 mg total) by mouth 2 (two) times daily as needed for muscle spasms. 10/17/23   Tegeler, Lonni PARAS, MD  DULoxetine (CYMBALTA) 60 MG capsule Take 60 mg by mouth daily.    [provider]  ferrous sulfate  325 (65 FE) MG EC tablet Take 325 mg by mouth 3 (three) times daily with meals.    [provider]  gabapentin (NEURONTIN) 300 MG capsule Patient takes 2 capsules in the morning and 1 capsule in the afternoon. 11/30/21   [provider]  hydroxychloroquine  (PLAQUENIL ) 200 MG tablet Take 1 tablet (200 mg total) by mouth daily. 10/12/23   Jeannetta Lonni ORN, MD  hydrOXYzine (ATARAX) 25 MG tablet Take 25 mg by mouth 2 (two) times daily. 03/05/23   [provider]  ibuprofen  (ADVIL ) 600 MG tablet Take 1 tablet (600 mg total) by mouth every 6 (six) hours as needed. 05/08/23   Nivia Colon, PA-C  lamoTRIgine (LAMICTAL) 100 MG tablet Take 1 tablet by mouth 2 (two) times daily. Patient not taking: Reported on 10/12/2023 06/29/22   [provider]  lidocaine  (LIDODERM ) 5 % Place 1 patch onto the skin daily. Remove & Discard patch within 12 hours or as directed by MD 10/17/23   Tegeler, Lonni PARAS, MD  Magnesium 300 MG CAPS Take by mouth.    [provider]  MELEYA  0.35 MG tablet SMARTSIG:1 Tablet(s) Daily    [provider]  methylPREDNISolone  (MEDROL  DOSEPAK) 4 MG TBPK tablet Please follow the prescriptions on the Dosepak 10/17/23   Tegeler, Lonni PARAS, MD  mirtazapine (REMERON) 15 MG tablet Take 7.5-15 mg by mouth at bedtime. Patient not taking: Reported on 10/12/2023    [provider]  ondansetron  (ZOFRAN ) 4 MG tablet Take 1 tablet (4 mg total) by mouth every 6 (six) hours. Patient not taking: Reported on 10/12/2023 09/23/23   Myriam Dorn BROCKS, PA  oxyCODONE -acetaminophen  (PERCOCET/ROXICET) 5-325 MG tablet Take 1 tablet by mouth every 4 (four) hours as needed for severe pain (pain score 7-10). 10/17/23   Tegeler, Lonni PARAS, MD  Probiotic Product (PROBIOTIC BLEND) CAPS Take by mouth.    [provider]  promethazine -dextromethorphan (PROMETHAZINE -DM) 6.25-15 MG/5ML syrup Take 10 mLs by mouth 3 (three) times daily as needed for cough.  11/02/23   Reddick, Johnathan B, NP    Allergies: Amoxicillin, Medroxyprogesterone , Penicillins, and Latex    Review of Systems  Gastrointestinal:  Positive for abdominal pain.    Updated Vital Signs BP 92/74 (BP Location: Right Arm)   Pulse 65   Temp 98.7 F (37.1 C) (Oral)   Resp 17   Wt 123.4 kg   LMP 11/29/2023   SpO2 99%   BMI 43.90 kg/m   Physical Exam Vitals and nursing note reviewed.  Constitutional:      General: She is not in acute distress.    Appearance: She is well-developed.  HENT:     Head: Normocephalic and atraumatic.     Right Ear: External ear normal.     Left Ear: External ear normal.  Eyes:     General: No scleral icterus.       Right eye: No discharge.        Left eye: No discharge.     Conjunctiva/sclera: Conjunctivae normal.  Neck:     Trachea: No tracheal deviation.  Cardiovascular:     Rate and Rhythm: Normal rate and regular rhythm.  Pulmonary:     Effort: Pulmonary effort is normal. No respiratory distress.     Breath sounds: Normal breath sounds. No stridor. No wheezing or rales.  Abdominal:     General: Bowel sounds are normal. There is no distension.     Palpations: Abdomen is soft.     Tenderness: There is abdominal tenderness in the epigastric area. There is no guarding or rebound.  Musculoskeletal:        General: No tenderness or deformity.     Cervical back: Neck supple.  Skin:    General: Skin is warm and dry.     Findings: No rash.  Neurological:     General: No focal deficit present.     Mental Status: She is alert.     Cranial Nerves: No cranial nerve deficit, dysarthria or facial asymmetry.     Sensory: No sensory deficit.     Motor: No abnormal muscle tone or seizure activity.     Coordination: Coordination normal.  Psychiatric:        Mood and Affect: Mood normal.     (all labs ordered are listed, but only abnormal results are displayed) Labs Reviewed  COMPREHENSIVE METABOLIC PANEL WITH GFR - Abnormal;  Notable for the following components:      Result Value   Glucose, Bld 163 (*)    All other components within normal limits  URINALYSIS, ROUTINE W REFLEX MICROSCOPIC - Abnormal; Notable for the following components:   Color, Urine STRAW (*)    Leukocytes,Ua TRACE (*)    All other components within normal limits  URINALYSIS, MICROSCOPIC (REFLEX) - Abnormal; Notable for the following components:   Bacteria, UA MANY (*)    All other components within normal limits  LIPASE, BLOOD  CBC  PREGNANCY, URINE  EKG: EKG Interpretation Date/Time:  Wednesday January 18 2024 08:23:19 EST Ventricular Rate:  66 PR Interval:  133 QRS Duration:  101 QT Interval:  441 QTC Calculation: 463 R Axis:   -21  Text Interpretation: Sinus rhythm Borderline left axis deviation Confirmed by Randol Simmonds [phone]) on 01/18/2024 8:25:22 AM  Radiology: CT Angio Chest PE W and/or Wo Contrast Result Date: 01/18/2024 EXAM: CTA CHEST 01/18/2024 08:05:44 AM TECHNIQUE: CTA of the chest was performed without and with the administration of 75 mL of intravenous contrast (iohexol  (OMNIPAQUE ) 350 MG/ML injection 100 mL IOHEXOL  350 MG/ML SOLN). Multiplanar reformatted images are provided for review. MIP images are provided for review. Automated exposure control, iterative reconstruction, and/or weight based adjustment of the mA/kV was utilized to reduce the radiation dose to as low as reasonably achievable. COMPARISON: 10/17/2023 CLINICAL HISTORY: Pulmonary embolism (PE) suspected, high prob. FINDINGS: PULMONARY ARTERIES: Pulmonary arteries are adequately opacified for evaluation. No acute pulmonary embolus. Main pulmonary artery is normal in caliber. MEDIASTINUM: The heart and pericardium demonstrate no acute abnormality. There is no acute abnormality of the thoracic aorta. LYMPH NODES: No mediastinal, hilar or axillary lymphadenopathy. LUNGS AND PLEURA: No pneumothorax, pleural effusion, airspace consolidation, or interstitial  edema. UPPER ABDOMEN: Limited images of the upper abdomen are unremarkable. SOFT TISSUES AND BONES: No acute bone or soft tissue abnormality. IMPRESSION: 1. No pulmonary embolism or acute pulmonary abnormality. Electronically signed by: Waddell Calk MD 01/18/2024 08:54 AM EST RP Workstation: [id number]     Procedures   Medications Ordered in the ED  ketorolac  (TORADOL ) 15 MG/ML injection 15 mg (15 mg Intravenous Given 01/18/24 0810)  iohexol  (OMNIPAQUE ) 350 MG/ML injection 100 mL (75 mLs Intravenous Contrast Given 01/18/24 0801)    Clinical Course as of 01/18/24 1035  Wed Jan 18, 2024  0733 Comprehensive metabolic panel(!) Normal [JK]  0733 CBC Normal [JK]  0733 Lipase, blood All [JK]  0818 Urinalysis, Microscopic (reflex)(!) Urinalysis does show 21-50 white blood cells many bacteria [JK]  1018 Ct scan without acute abnormality  [JK]    Clinical Course User Index [JK] Randol Simmonds, MD                                 Medical Decision Making Problems Addressed: Abdominal pain, unspecified abdominal location: acute illness or injury that poses a threat to life or bodily functions Acute cystitis without hematuria: acute illness or injury  Amount and/or Complexity of Data Reviewed Labs: ordered. Decision-making details documented in ED Course. Radiology: ordered and independent interpretation performed.  Risk Prescription drug management.   Patient presented to the ED with complaints of upper abdominal pain ongoing for couple of weeks.  Patient reports pain in her upper abdomen.  She also states the pain has moved up into her chest now.  On exam patient has some tenderness in the epigastric region but no tenderness in the lower abdomen.  Low suspicion for colitis diverticulitis appendicitis.  Labs do not show any signs of hepatitis or pancreatitis.  With the pain radiating up into her chest consider the possibility of cardiopulmonary etiology.  CT scan was performed and it does  not show any evidence of PE.  There is also no acute abnormality noted of the upper abdominal portion of her CT scan.  Urinalysis does suggest possible urinary tract infection although no clear urinary symptoms.  Will start her on a course of antibiotics.  Evaluation and diagnostic testing in the  emergency department does not suggest an emergent condition requiring admission or immediate intervention beyond what has been performed at this time.  The patient is safe for discharge and has been instructed to return immediately for worsening symptoms, change in symptoms or any other concerns.     Final diagnoses:  Acute cystitis without hematuria  Abdominal pain, unspecified abdominal location    ED Discharge Orders          Ordered    cephALEXin  (KEFLEX ) 500 MG capsule  4 times daily        01/18/24 1033    naproxen (NAPROSYN) 375 MG tablet  2 times daily        01/18/24 1033               Randol Simmonds, MD 01/18/24 1035  "

## 2024-01-18 NOTE — ED Triage Notes (Addendum)
 Patient c/o abdominal pain that she's been ignoring for 2 weeks.  Patient states pain is generalized, but is worse on the LUQ. Patient endorses nausea and diarrhea.

## 2024-01-18 NOTE — ED Notes (Signed)
 Spoke with lab to add on urine culture.

## 2024-01-18 NOTE — ED Notes (Signed)

## 2024-01-18 NOTE — Discharge Instructions (Signed)
 Take the medications as prescribed to treat the possible urinary tract infection.  Follow-up with your primary care doctor to be rechecked.

## 2024-01-19 LAB — URINE CULTURE: Culture: >=100000 — AB

## 2024-01-20 ENCOUNTER — Telehealth (HOSPITAL_BASED_OUTPATIENT_CLINIC_OR_DEPARTMENT_OTHER): Payer: Self-pay

## 2024-01-20 NOTE — Telephone Encounter (Signed)
 Post ED Visit - Positive Culture Follow-up  Culture report reviewed by antimicrobial stewardship pharmacist: [facility] [x]  Elma Fail, Pharm.D. []  Venetia Gully, Pharm.D., BCPS AQ-ID []  Garrel Crews, Pharm.D., BCPS []  Almarie Lunger, [address].D., BCPS []  [place], [address].D., BCPS, AAHIVP []  Rosaline Bihari, Pharm.D., BCPS, AAHIVP []  Vernell Meier, PharmD, BCPS []  Latanya Hint, PharmD, BCPS []  Donald Medley, PharmD, BCPS []  Rocky Bold, PharmD []  Dorothyann Alert, PharmD, BCPS []  Morene Babe, PharmD  [facility] []  Rosaline Edison, PharmD []  Romona Bliss, PharmD []  Dolphus Roller, PharmD []  Veva Seip, Rph []  Vernell Daunt) Leonce, PharmD []  Eva Allis, PharmD []  Rosaline Millet, PharmD []  Iantha Batch, PharmD []  Arvin Gauss, PharmD []  Wanda Hasting, PharmD []  Ronal Rav, PharmD []  Rocky Slade, PharmD []  Bard Jeans, PharmD   Positive urine culture No urinary s/s, assessment - ASB no further. Plan - Stop antibiotics as patient denied urinary s/s. No further patient follow-up is required at this time.  Kathleen Sanders 01/20/2024, 1:23 PM

## 2024-02-07 ENCOUNTER — Ambulatory Visit
Admission: EM | Admit: 2024-02-07 | Discharge: 2024-02-07 | Disposition: A | Discharge status: Home / Self Care | Attending: Family Medicine | Admitting: Family Medicine

## 2024-02-07 ENCOUNTER — Ambulatory Visit (INDEPENDENT_AMBULATORY_CARE_PROVIDER_SITE_OTHER)

## 2024-02-07 ENCOUNTER — Ambulatory Visit: Payer: Self-pay | Admitting: Nurse Practitioner

## 2024-02-07 DIAGNOSIS — M25511 Pain in right shoulder: Secondary | ICD-10-CM | POA: Diagnosis not present

## 2024-02-07 DIAGNOSIS — R0789 Other chest pain: Secondary | ICD-10-CM

## 2024-02-07 MED ORDER — DEXAMETHASONE SOD PHOSPHATE PF 10 MG/ML IJ SOLN
10.0000 mg | Freq: Once | INTRAMUSCULAR | Status: AC
  Administered 2024-02-07: 10 mg via INTRAMUSCULAR

## 2024-02-07 MED ORDER — METHYLPREDNISOLONE 4 MG PO TBPK: ORAL_TABLET | ORAL | 0 refills | Status: AC

## 2024-02-07 MED ORDER — METHOCARBAMOL 750 MG PO TABS: 750.0000 mg | ORAL_TABLET | Freq: Two times a day (BID) | ORAL | 0 refills | Status: AC | PRN

## 2024-02-07 NOTE — ED Triage Notes (Signed)
 Pt states for the past 3 weeks she has had pain to her right armpit and she thought she had a swollen gland.  Now she states the pain is in her right upper arm,shoulder and chest.  States she feels like her lung hurts.

## 2024-02-07 NOTE — Discharge Instructions (Signed)
 Start Robaxin  muscle relaxer twice daily as needed.  Please of this medication will make you drowsy.  Do not drink alcohol or drive on this medication.  Start Medrol  Dosepak as prescribed.  Heat to the affected areas and lots of rest.  Please follow-up with your rheumatologist or PCP in 2 to 3 days for recheck.  Please go to the emergency room for any worsening symptoms.  Hope you feel better soon!

## 2024-02-07 NOTE — ED Provider Notes (Signed)
 " [facility]    CSN: [id number] Arrival date & time: 02/07/24  0845      History   Chief Complaint Chief Complaint  Patient presents with   Arm Pain    HPI Kathleen Sanders is a 34 y.o. female with a past medical history of rheumatoid arthritis, fibromyalgia, PCOS, asthma presents for right axillary/shoulder and chest wall pain.  Patient reports 3 weeks ago began initially as right axillary pain that is spread down to her right shoulder into her anterior lateral and posterior right chest.  Pain is worse with palpation and deep breathing.  Denies any known injury or inciting event.  Denies any swelling, bruising, erythema or warmth, fevers or chills.  No rashes.  Does endorse weakness to the right arm.  No shortness of breath, hemoptysis, chest pain.  Does not take estrogen birth control.  No lower extremity swelling or edema.  No orthopnea.  Does have some right sided neck pain as well.  No headaches.  She states she does not typically have RA symptoms in her shoulder and chest that is typically her hands hips and back.  She was seen in the emergency room on 12/31 for upper abdominal pain that goes up into her chest that resolves with deep breathing.  She did have a CT angio negative for PE or pulmonary abnormality.  She did have a positive UA and was started on Keflex  as well as naproxen .  She states she has been taking naproxen , ibuprofen  and Tylenol  without any relief.  Currently rates her pain as an 8 out of 10.  Last dose of ibuprofen  was 2 hours ago and she took 800 mg.  Denies pregnancy or breast-feeding.  No other concerns   Arm Pain    Past Medical History:  Diagnosis Date   Anemia    Asthma    seasonal   Degenerative disc disease, lumbar    Fibroid    Fibromyalgia    HNP (herniated nucleus pulposus), thoracic    Hx of chronic pyelonephritis    Migraine with aura    PCOS (polycystic ovarian syndrome)    Pyelonephritis    Rheumatoid arthritis (HCC)    UTI (urinary  tract infection)     Patient Active Problem List   Diagnosis Date Noted   Bilateral hand numbness 03/08/2023   Rheumatoid factor positive 02/24/2022   Fibromyalgia syndrome 02/24/2022   Polyarthralgia 02/24/2022   Miscarriage 08/05/2021   Cesarean delivery, delivered, current hospitalization 08/31/2016   Umbilical cord hernia, fetal, affecting care of mother, antepartum 05/14/2016   [redacted] weeks gestation of pregnancy    Migraine with aura    Back pain 10/18/2013    Past Surgical History:  Procedure Laterality Date   CESAREAN SECTION N/A 08/31/2016   Procedure: CESAREAN SECTION;  Surgeon: Okey Leader, MD;  Location: [facility]IR[facility];  Service: Obstetrics;  Laterality: N/A;    OB History     Gravida  3   Para  2   Term  2   Preterm  0   AB  0   Living  2      SAB  0   IAB  0   Ectopic  0   Multiple  0   Live Births  2            Home Medications    Prior to Admission medications  Medication Sig Start Date End Date Taking? Authorizing Provider  methocarbamol  (ROBAXIN ) 750 MG tablet Take 1 tablet (750  mg total) by mouth 2 (two) times daily as needed for muscle spasms. 02/07/24  Yes Damya Comley, Jodi R, NP  methylPREDNISolone  (MEDROL  DOSEPAK) 4 MG TBPK tablet Take as prescribed on package 02/07/24  Yes Charolett Yarrow, Jodi R, NP  acetaminophen  (TYLENOL ) 325 MG tablet Take 325-650 mg by mouth every 6 (six) hours as needed (for pain/headache.).    [provider]  albuterol  (VENTOLIN  HFA) 108 (90 Base) MCG/ACT inhaler 1 puff every 4 (four) hours. Patient not taking: Reported on 10/12/2023 05/21/22   [provider]  ALPRAZolam (XANAX) 1 MG tablet Take 1 mg by mouth 2 (two) times daily. 02/10/23   [provider]  azelastine  (ASTELIN ) 0.1 % nasal spray Place 1 spray into both nostrils 2 (two) times daily. Use in each nostril as directed 11/02/23   Reddick, Johnathan B, NP  cephALEXin  (KEFLEX ) 500 MG capsule Take 1 capsule (500 mg total) by mouth 4  (four) times daily. 01/18/24   Randol Simmonds, MD  Cranberry 50 MG CHEW Chew by mouth.    [provider]  DULoxetine (CYMBALTA) 60 MG capsule Take 60 mg by mouth daily.    [provider]  ferrous sulfate 325 (65 FE) MG EC tablet Take 325 mg by mouth 3 (three) times daily with meals.    [provider]  gabapentin (NEURONTIN) 300 MG capsule Patient takes 2 capsules in the morning and 1 capsule in the afternoon. 11/30/21   [provider]  hydroxychloroquine  (PLAQUENIL ) 200 MG tablet Take 1 tablet (200 mg total) by mouth daily. 10/12/23   Jeannetta Lonni ORN, MD  hydrOXYzine (ATARAX) 25 MG tablet Take 25 mg by mouth 2 (two) times daily. 03/05/23   [provider]  ibuprofen  (ADVIL ) 600 MG tablet Take 1 tablet (600 mg total) by mouth every 6 (six) hours as needed. 05/08/23   Nivia Colon, PA-C  lamoTRIgine (LAMICTAL) 100 MG tablet Take 1 tablet by mouth 2 (two) times daily. Patient not taking: Reported on 10/12/2023 06/29/22   [provider]  lidocaine  (LIDODERM ) 5 % Place 1 patch onto the skin daily. Remove & Discard patch within 12 hours or as directed by MD 10/17/23   Tegeler, Lonni PARAS, MD  Magnesium 300 MG CAPS Take by mouth.    [provider]  MELEYA  0.35 MG tablet SMARTSIG:1 Tablet(s) Daily    [provider]  mirtazapine (REMERON) 15 MG tablet Take 7.5-15 mg by mouth at bedtime. Patient not taking: Reported on 10/12/2023    [provider]  naproxen  (NAPROSYN ) 375 MG tablet Take 1 tablet (375 mg total) by mouth 2 (two) times daily. 01/18/24   Randol Simmonds, MD  ondansetron  (ZOFRAN ) 4 MG tablet Take 1 tablet (4 mg total) by mouth every 6 (six) hours. Patient not taking: Reported on 10/12/2023 09/23/23   Myriam Dorn BROCKS, PA  oxyCODONE -acetaminophen  (PERCOCET/ROXICET) 5-325 MG tablet Take 1 tablet by mouth every 4 (four) hours as needed for severe pain (pain score 7-10). 10/17/23   Tegeler, Lonni PARAS, MD  Probiotic  Product (PROBIOTIC BLEND) CAPS Take by mouth.    [provider]  promethazine -dextromethorphan (PROMETHAZINE -DM) 6.25-15 MG/5ML syrup Take 10 mLs by mouth 3 (three) times daily as needed for cough. 11/02/23   Aurea Ethel NOVAK, NP    Family History Family History  Problem Relation Age of Onset   Heart disease Mother        heart attacks, had triple bipass   Hypertension Mother    Peripheral Artery Disease Mother  Hypertension Father    Prostate cancer Father    Colon cancer Maternal Grandmother    Colon cancer Paternal Grandfather    Prostate cancer Paternal Grandfather        also lung cancer    Social History Social History[1]   Allergies   Amoxicillin, Medroxyprogesterone , Penicillins, and Latex   Review of Systems Review of Systems  Musculoskeletal:        Right shoulder/axillary and right sided chest wall pain     Physical Exam Triage Vital Signs ED Triage Vitals  Encounter Vitals Group     BP 02/07/24 0859 119/74     Girls Systolic BP Percentile --      Girls Diastolic BP Percentile --      Boys Systolic BP Percentile --      Boys Diastolic BP Percentile --      Pulse Rate 02/07/24 0859 80     Resp 02/07/24 0859 16     Temp 02/07/24 0859 98.1 F (36.7 C)     Temp Source 02/07/24 0859 Oral     SpO2 02/07/24 0859 98 %     Weight --      Height --      Head Circumference --      Peak Flow --      Pain Score 02/07/24 0858 8     Pain Loc --      Pain Education --      Exclude from Growth Chart --    No data found.  Updated Vital Signs BP 119/74 (BP Location: Left Arm)   Pulse 80   Temp 98.1 F (36.7 C) (Oral)   Resp 16   SpO2 98%   Visual Acuity Right Eye Distance:   Left Eye Distance:   Bilateral Distance:    Right Eye Near:   Left Eye Near:    Bilateral Near:     Physical Exam Vitals and nursing note reviewed.  Constitutional:      General: She is not in acute distress.    Appearance: Normal appearance. She is obese.  She is not ill-appearing, toxic-appearing or diaphoretic.  HENT:     Head: Normocephalic and atraumatic.  Eyes:     Pupils: Pupils are equal, round, and reactive to light.  Cardiovascular:     Rate and Rhythm: Normal rate and regular rhythm.     Heart sounds: Normal heart sounds.  Pulmonary:     Effort: Pulmonary effort is normal.     Breath sounds: Normal breath sounds. No wheezing, rhonchi or rales.  Chest:     Chest wall: Tenderness present.  Musculoskeletal:       Arms:     Comments: There is no swelling, ecchymosis or erythema of the right axillary/right shoulder or right chest wall.  Tender to palpation to all these areas.  There is no abscess or induration fluctuance of the axillary area.  There is also tenderness to the right paracervical muscles that extends to her right trapezius.  Full range of motion of neck with pain with left-sided rotation.  There is no cervical tenderness with palpation.  Equal chest expansion bilaterally.  Strength is 5 out of 5 left upper extremity and 4/5 right upper extremity  Skin:    General: Skin is warm and dry.     Findings: No rash.  Neurological:     General: No focal deficit present.     Mental Status: She is alert and oriented to person, place, and time.  Psychiatric:        Mood and Affect: Mood normal.        Behavior: Behavior normal.      UC Treatments / Results  Labs (all labs ordered are listed, but only abnormal results are displayed) Labs Reviewed - No data to display  tains abnormal data Comprehensive metabolic panel Order: [id number]  Status: Final result     Next appt: 04/10/2024 at 10:00 AM in Rheumatology Ananias LELON Ester, MD)   Test Result Released: Yes (seen)   0 Result Notes          Component Ref Range & Units (hover) 2 wk ago (01/18/24) 3 mo ago (10/17/23) 4 mo ago (09/23/23) 9 mo ago (05/08/23) 1 yr ago (12/22/22) 1 yr ago (09/22/22) 1 yr ago (09/07/22)  Sodium 138 141 139 138 140 VC 139 140  Potassium 3.8  4.3 3.8 3.8 3.9 VC 3.8 4.5  Chloride 102 107 104 105 104 VC 103 105  CO2 25 24 26 25 27  VC 24 28  Glucose, Bld 163 High  127 High  CM 129 High  CM 105 High  CM 142 High  VC, CM 134 High  CM 87 CM  Comment: Glucose reference range applies only to samples taken after fasting for at least 8 hours.  BUN 9 9 10 12 9  VC 9 11  Creatinine, Ser 0.82 0.94 0.95 0.94 0.94 VC 0.91 0.82  Calcium 9.4 9.0 8.9 9.0 9.6 VC 9.0 9.3  Total Protein 6.5 6.4 Low  6.4 Low  6.6  7.1 6.5  Albumin 4.1 4.1 4.2 4.3  4.0 4.1  AST 18 21 17 18  19 23   ALT 19 17 15 21  20 17   Alkaline Phosphatase 60 55 57 59  48 50  Total Bilirubin 0.4 0.3 0.4 0.6  0.5 R 0.4 R  GFR, Estimated >60 >60 CM >60 CM >60 CM >60 VC, CM >60 CM >60 CM  Comment: (NOTE) Calculated using the CKD-EPI Creatinine Equation (2021)  Anion gap 11 11 CM 9 CM 8 CM 9 VC, CM 12 CM 7 CM  Comment: Performed at [facility], [address], [place], [place] [address]  Resulting Agency [facility] LAB        Specimen Collected: 01/18/24 05:59 Last Resulted: 01/18/24 07:21    EKG   Radiology DG Shoulder Right Result Date: 02/07/2024 CLINICAL DATA:  Right shoulder pain for 3 weeks EXAM: RIGHT SHOULDER - 2+ VIEW COMPARISON:  None Available. FINDINGS: There is no evidence of fracture or dislocation. There is no evidence of arthropathy or other focal bone abnormality. Soft tissues are unremarkable. IMPRESSION: Negative. Electronically Signed   By: Lynwood Landy Raddle M.D.   On: 02/07/2024 09:56   DG Chest 2 View Result Date: 02/07/2024 CLINICAL DATA:  Chest wall pain for 3 weeks EXAM: CHEST - 2 VIEW COMPARISON:  November 02, 2023 FINDINGS: The heart size and mediastinal contours are within normal limits. Both lungs are clear. The visualized skeletal structures are unremarkable. IMPRESSION: No active cardiopulmonary disease. Electronically Signed   By: Lynwood Landy Raddle M.D.   On:  02/07/2024 09:55    Procedures Procedures (including critical care time)  Medications Ordered in UC Medications  dexamethasone  (DECADRON ) injection 10 mg (10 mg Intramuscular Given 02/07/24 0934)    Initial Impression / Assessment and Plan / UC Course  I have reviewed  the triage vital signs and the nursing notes.  Pertinent labs & imaging results that were available during my care of the patient were reviewed by me and considered in my medical decision making (see chart for details).     Discussed exam with patient.  No signs of abscess or cellulitis/infection of the axillary area.  As she took ibuprofen  2 hours ago unable to do ketorolac .  She was given Decadron  injection in clinic with improvement in symptoms.  X-rays negative.  Unclear cause of symptoms discussed likely musculoskeletal cause but unclear if underlying RA or fibromyalgia contributing.  Will do trial of Robaxin  and Medrol  Dosepak.  Heat to the areas and advised PCP or rheumatology follow-up 2 to 3 days for recheck.  ER precautions reviewed and patient verbalized understanding Final Clinical Impressions(s) / UC Diagnoses   Final diagnoses:  Right-sided chest wall pain  Acute pain of right shoulder     Discharge Instructions      Start Robaxin  muscle relaxer twice daily as needed.  Please of this medication will make you drowsy.  Do not drink alcohol or drive on this medication.  Start Medrol  Dosepak as prescribed.  Heat to the affected areas and lots of rest.  Please follow-up with your rheumatologist or PCP in 2 to 3 days for recheck.  Please go to the emergency room for any worsening symptoms.  Hope you feel better soon!     ED Prescriptions     Medication Sig Dispense Auth. Provider   methocarbamol  (ROBAXIN ) 750 MG tablet Take 1 tablet (750 mg total) by mouth 2 (two) times daily as needed for muscle spasms. 10 tablet Sophiea Ueda, Jodi R, NP   methylPREDNISolone  (MEDROL  DOSEPAK) 4 MG TBPK tablet Take as prescribed on  package 21 tablet Litzy Dicker, Jodi R, NP      PDMP not reviewed this encounter.     [1]  Social History Tobacco Use   Smoking status: Former    Current packs/day: 0.00    Average packs/day: 0.1 packs/day for 2.0 years (0.2 ttl pk-yrs)    Types: Cigarettes    Start date: 05/19/2010    Quit date: 05/18/2012    Years since quitting: 11.7    Passive exposure: Past   Smokeless tobacco: Never  Vaping Use   Vaping status: Former   Devices: Socially  Substance Use Topics   Alcohol use: Yes    Comment: occ   Drug use: Never     Loreda Myla SAUNDERS, NP 02/07/24 1004  "

## 2024-04-10 ENCOUNTER — Ambulatory Visit: Admitting: Internal Medicine
